# Patient Record
Sex: Female | Born: 1955 | Race: White | Hispanic: No | Marital: Married | Smoking: Never smoker
Health system: Southern US, Community
[De-identification: ages and names within clinical notes are randomized; demographics above are authoritative.]

## PROBLEM LIST (undated history)

## (undated) DIAGNOSIS — I82409 Acute embolism and thrombosis of unspecified deep veins of unspecified lower extremity: Secondary | ICD-10-CM

## (undated) DIAGNOSIS — I10 Essential (primary) hypertension: Secondary | ICD-10-CM

## (undated) HISTORY — DX: Acute embolism and thrombosis of unspecified deep veins of unspecified lower extremity: I82.409

## (undated) HISTORY — PX: COLONOSCOPY WITH PROPOFOL: SHX5780

## (undated) HISTORY — DX: Essential (primary) hypertension: I10

---

## 1985-06-08 DIAGNOSIS — C439 Malignant melanoma of skin, unspecified: Secondary | ICD-10-CM

## 1985-06-08 HISTORY — DX: Malignant melanoma of skin, unspecified: C43.9

## 2001-06-08 HISTORY — PX: BLADDER REMOVAL: SHX567

## 2001-09-29 ENCOUNTER — Other Ambulatory Visit: Admission: RE | Admit: 2001-09-29 | Discharge: 2001-09-29 | Payer: Self-pay | Admitting: Family Medicine

## 2004-03-09 ENCOUNTER — Other Ambulatory Visit: Payer: Self-pay

## 2004-03-09 ENCOUNTER — Emergency Department: Payer: Self-pay | Admitting: Emergency Medicine

## 2005-05-08 ENCOUNTER — Ambulatory Visit: Payer: Self-pay | Admitting: Family Medicine

## 2006-01-06 DIAGNOSIS — I10 Essential (primary) hypertension: Secondary | ICD-10-CM | POA: Insufficient documentation

## 2006-05-10 ENCOUNTER — Ambulatory Visit: Payer: Self-pay | Admitting: Family Medicine

## 2006-06-08 HISTORY — PX: OTHER SURGICAL HISTORY: SHX169

## 2006-10-22 DIAGNOSIS — I82409 Acute embolism and thrombosis of unspecified deep veins of unspecified lower extremity: Secondary | ICD-10-CM

## 2006-10-22 HISTORY — DX: Acute embolism and thrombosis of unspecified deep veins of unspecified lower extremity: I82.409

## 2006-10-25 ENCOUNTER — Ambulatory Visit: Payer: Self-pay | Admitting: Family Medicine

## 2006-10-27 ENCOUNTER — Ambulatory Visit: Payer: Self-pay | Admitting: Family Medicine

## 2007-05-18 ENCOUNTER — Ambulatory Visit: Payer: Self-pay | Admitting: Family Medicine

## 2007-09-26 DIAGNOSIS — G4700 Insomnia, unspecified: Secondary | ICD-10-CM | POA: Insufficient documentation

## 2009-07-29 DIAGNOSIS — E669 Obesity, unspecified: Secondary | ICD-10-CM | POA: Insufficient documentation

## 2009-07-30 ENCOUNTER — Ambulatory Visit: Payer: Self-pay | Admitting: Family Medicine

## 2009-10-22 ENCOUNTER — Ambulatory Visit: Payer: Self-pay | Admitting: Gastroenterology

## 2009-10-22 LAB — HM COLONOSCOPY

## 2010-09-24 ENCOUNTER — Ambulatory Visit: Payer: Self-pay | Admitting: Family Medicine

## 2011-12-08 ENCOUNTER — Ambulatory Visit: Payer: Self-pay | Admitting: Family Medicine

## 2011-12-08 LAB — HM MAMMOGRAPHY: HM Mammogram: NORMAL

## 2013-01-25 LAB — HM PAP SMEAR: HM Pap smear: NEGATIVE

## 2014-03-01 LAB — LIPID PANEL
CHOLESTEROL: 224 mg/dL — AB (ref 0–200)
HDL: 57 mg/dL (ref 35–70)
LDL Cholesterol: 149 mg/dL
TRIGLYCERIDES: 89 mg/dL (ref 40–160)

## 2014-03-01 LAB — BASIC METABOLIC PANEL
BUN: 21 mg/dL (ref 4–21)
Creatinine: 0.9 mg/dL (ref 0.5–1.1)
Glucose: 98 mg/dL
Potassium: 5.4 mmol/L — AB (ref 3.4–5.3)
Sodium: 142 mmol/L (ref 137–147)

## 2014-03-01 LAB — TSH: TSH: 3.58 u[IU]/mL (ref 0.41–5.90)

## 2014-04-30 ENCOUNTER — Ambulatory Visit: Payer: Self-pay | Admitting: Family Medicine

## 2015-02-28 DIAGNOSIS — M79674 Pain in right toe(s): Secondary | ICD-10-CM | POA: Insufficient documentation

## 2015-02-28 DIAGNOSIS — Z8619 Personal history of other infectious and parasitic diseases: Secondary | ICD-10-CM | POA: Insufficient documentation

## 2015-02-28 DIAGNOSIS — N393 Stress incontinence (female) (male): Secondary | ICD-10-CM | POA: Insufficient documentation

## 2015-03-15 ENCOUNTER — Encounter: Payer: Self-pay | Admitting: Family Medicine

## 2015-03-29 ENCOUNTER — Encounter: Payer: Self-pay | Admitting: Family Medicine

## 2015-03-29 ENCOUNTER — Ambulatory Visit (INDEPENDENT_AMBULATORY_CARE_PROVIDER_SITE_OTHER): Payer: Commercial Managed Care - PPO | Admitting: Family Medicine

## 2015-03-29 VITALS — BP 116/82 | HR 80 | Temp 98.8°F | Resp 16 | Ht 61.16 in | Wt 189.0 lb

## 2015-03-29 DIAGNOSIS — I10 Essential (primary) hypertension: Secondary | ICD-10-CM | POA: Diagnosis not present

## 2015-03-29 DIAGNOSIS — Z0000 Encounter for general adult medical examination without abnormal findings: Secondary | ICD-10-CM | POA: Diagnosis not present

## 2015-03-29 NOTE — Progress Notes (Signed)
Patient: Christy Johnson, Female    DOB: Jan 20, 1956, 59 y.o.   MRN: [id number] Visit Date: 03/29/2015  Today's Provider: Lelon Huh, MD   Chief Complaint  Patient presents with  . Annual Exam  . Hypertension  . Hyperlipidemia   Subjective:    Annual physical exam Christy Johnson is a 59 y.o. female who presents today for health maintenance and complete physical. She feels well. She reports exercising yes/walking. She reports she is sleeping poorly most nights.  -----------------------------------------------------------------      Hypertension, follow-up:  BP Readings from Last 3 Encounters:  03/29/15 116/82  03/01/14 120/82    She was last seen for hypertension 13  months ago.  BP at that visit was 120/82. Management since that visit includes none .She reports good compliance with treatment. She is not having side effects. none  She is exercising. She is not adherent to low salt diet.   Outside blood pressures are 116/70. She is experiencing none.  Patient denies none.   Cardiovascular risk factors include none.  Use of agents associated with hypertension: none.   ----------------------------------------------------------------------    Lipid/Cholesterol, Follow-up:   Last seen for this 13  months ago.  Management since that visit includes none.  Last Lipid Panel:    Component Value Date/Time   CHOL 224* 03/01/2014   TRIG 89 03/01/2014   HDL 57 03/01/2014   LDLCALC 149 03/01/2014    She reports good compliance with treatment. She is not having side effects. none  Wt Readings from Last 3 Encounters:  03/29/15 189 lb (85.73 kg)  03/01/14 209 lb (94.802 kg)    ----------------------------------------------------------------------    Review of Systems  Constitutional: Negative.   HENT: Negative.   Eyes: Negative.   Respiratory: Negative.   Cardiovascular: Negative.   Gastrointestinal: Negative.   Endocrine: Negative.     Genitourinary: Negative.   Musculoskeletal: Negative.   Skin: Negative.   Allergic/Immunologic: Negative.   Neurological: Negative.   Hematological: Negative.   Psychiatric/Behavioral: Negative.     Social History She  reports that she has never smoked. She does not have any smokeless tobacco history on file. She reports that she drinks alcohol. She reports that she does not use illicit drugs. Social History   Social History  . Marital Status: Married    Spouse Name: N/A  . Number of Children: N/A  . Years of Education: N/A   Social History Main Topics  . Smoking status: Never Smoker   . Smokeless tobacco: None  . Alcohol Use: Yes     Comment: Ocassionally once a month  . Drug Use: No  . Sexual Activity: Not Asked   Other Topics Concern  . None   Social History Narrative    Patient Active Problem List   Diagnosis Date Noted  . History of chicken pox 02/28/2015  . Stress bladder incontinence, female 02/28/2015  . Obesity (BMI 30.0-34.9) 07/29/2009  . Insomnia 09/26/2007  . 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 Hypertension, essential 01/06/2006    Past Surgical History  Procedure Laterality Date  . Surgery on dvt fron stent put in  2008  . Bladder removal  2003    Removed from Left Ovary    Family History  Family Status  Relation Status Death Age  . Mother Alive   . Father Alive   . Sister Alive     Parathyroid   Her family history includes Cerebrovascular Accident  in her father; Hypertension in her father and mother.    Allergies  Allergen Reactions  . Penicillins Rash    Previous Medications   ASPIRIN 81 MG EC TABLET    Take 1 tablet by mouth daily.   LISINOPRIL (PRINIVIL,ZESTRIL) 10 MG TABLET    Take 10 mg by mouth daily.   MULTIPLE VITAMINS TABLET    Take 1 tablet by mouth daily.   OMEGA-3 FATTY ACIDS (FISH OIL) 1200 MG CAPS    Take 1 capsule by mouth daily.   PROBIOTIC CAPS    Take by mouth.    Patient  Care Team: Birdie Sons, MD as PCP - General (Family Medicine)     Objective:   Vitals: BP 116/82 mmHg  Pulse 80  Temp(Src) 98.8 F (37.1 C) (Oral)  Resp 16  Ht 5' 1.16" (1.553 m)  Wt 189 lb (85.73 kg)  BMI 35.55 kg/m2  SpO2 99%   Physical Exam   General Appearance:    Alert, cooperative, no distress, appears stated age  Head:    Normocephalic, without obvious abnormality, atraumatic  Eyes:    PERRL, conjunctiva/corneas clear, EOM's intact, fundi    benign, both eyes  Ears:    Normal TM's and external ear canals, both ears  Nose:   Nares normal, septum midline, mucosa normal, no drainage    or sinus tenderness  Throat:   Lips, mucosa, and tongue normal; teeth and gums normal  Neck:   Supple, symmetrical, trachea midline, no adenopathy;    thyroid:  no enlargement/tenderness/nodules; no carotid   bruit or JVD  Back:     Symmetric, no curvature, ROM normal, no CVA tenderness  Lungs:     Clear to auscultation bilaterally, respirations unlabored  Chest Wall:    No tenderness or deformity   Heart:    Regular rate and rhythm, S1 and S2 normal, no murmur, rub   or gallop  Breast Exam:    normal appearance, no masses or tenderness  Abdomen:     Soft, non-tender, bowel sounds active all four quadrants,    no masses, no organomegaly  Pelvic:    deferred and /not indicated  Extremities:   Extremities normal, atraumatic, no cyanosis or edema  Pulses:   2+ and symmetric all extremities  Skin:   Skin color, texture, turgor normal, no rashes or lesions  Lymph nodes:   Cervical, supraclavicular, and axillary nodes normal  Neurologic:   CNII-XII intact, normal strength, sensation and reflexes    throughout     Depression Screen PHQ 2/9 Scores 03/29/2015  PHQ - 2 Score 0  PHQ- 9 Score 0      Assessment & Plan:     Routine Health Maintenance and Physical Exam  Exercise Activities and Dietary recommendations Goals    None      Immunization History  Administered  Date(s) Administered  . Tdap 02/11/2006    Health Maintenance  Topic Date Due  . HIV Screening  08/19/1971  . MAMMOGRAM  12/07/2013  . INFLUENZA VACCINE  Getting at work  . PAP SMEAR  01/25/2018  . TETANUS/TDAP  02/12/2016  . COLONOSCOPY  10/23/2019  . Hepatitis C Screening  Completed      Discussed health benefits of physical activity, and encouraged her to engage in regular exercise appropriate for her age and condition.    --------------------------------------------------------------------

## 2015-03-30 LAB — RENAL FUNCTION PANEL
Albumin: 4.4 g/dL (ref 3.5–5.5)
BUN / CREAT RATIO: 24 — AB (ref 9–23)
BUN: 19 mg/dL (ref 6–24)
CO2: 24 mmol/L (ref 18–29)
Calcium: 9.4 mg/dL (ref 8.7–10.2)
Chloride: 100 mmol/L (ref 97–106)
Creatinine, Ser: 0.78 mg/dL (ref 0.57–1.00)
GFR, EST AFRICAN AMERICAN: 97 mL/min/{1.73_m2} (ref >59)
GFR, EST NON AFRICAN AMERICAN: 84 mL/min/{1.73_m2} (ref >59)
GLUCOSE: 102 mg/dL — AB (ref 65–99)
POTASSIUM: 5.2 mmol/L (ref 3.5–5.2)
Phosphorus: 4 mg/dL (ref 2.5–4.5)
SODIUM: 139 mmol/L (ref 136–144)

## 2015-03-30 LAB — LIPID PANEL
CHOL/HDL RATIO: 3.3 ratio (ref 0.0–4.4)
Cholesterol, Total: 197 mg/dL (ref 100–199)
HDL: 60 mg/dL (ref >39)
LDL Calculated: 125 mg/dL — ABNORMAL HIGH (ref 0–99)
Triglycerides: 59 mg/dL (ref 0–149)
VLDL CHOLESTEROL CAL: 12 mg/dL (ref 5–40)

## 2015-04-01 ENCOUNTER — Encounter: Payer: Self-pay | Admitting: Family Medicine

## 2015-04-02 ENCOUNTER — Telehealth: Payer: Self-pay

## 2015-04-02 NOTE — Telephone Encounter (Signed)
Advised pt as directed. Pt verbalized fully understanding.  Thanks,   

## 2015-04-02 NOTE — Telephone Encounter (Signed)
-----   Message from Birdie Sons, MD sent at 03/30/2015  8:11 AM EDT ----- Blood sugar, kidney functions, electrolytes and cholesterol are all normal. Check labs yearly.Continue current medications.

## 2015-04-05 ENCOUNTER — Encounter: Payer: Self-pay | Admitting: Family Medicine

## 2015-04-17 ENCOUNTER — Other Ambulatory Visit: Payer: Self-pay | Admitting: *Deleted

## 2015-04-17 MED ORDER — LISINOPRIL 10 MG PO TABS: 10.0000 mg | ORAL_TABLET | Freq: Every day | ORAL | Status: DC

## 2015-04-17 NOTE — Telephone Encounter (Signed)
Last ov was 03/29/15.

## 2016-06-12 ENCOUNTER — Other Ambulatory Visit: Payer: Self-pay | Admitting: Family Medicine

## 2016-07-08 ENCOUNTER — Encounter: Payer: Commercial Managed Care - PPO | Admitting: Family Medicine

## 2016-08-06 ENCOUNTER — Encounter: Payer: Self-pay | Admitting: Family Medicine

## 2016-08-06 ENCOUNTER — Ambulatory Visit (INDEPENDENT_AMBULATORY_CARE_PROVIDER_SITE_OTHER): Payer: Managed Care, Other (non HMO) | Admitting: Family Medicine

## 2016-08-06 VITALS — BP 118/70 | HR 78 | Temp 98.1°F | Resp 16 | Ht 61.0 in | Wt 195.0 lb

## 2016-08-06 DIAGNOSIS — Z1239 Encounter for other screening for malignant neoplasm of breast: Secondary | ICD-10-CM

## 2016-08-06 DIAGNOSIS — Z23 Encounter for immunization: Secondary | ICD-10-CM

## 2016-08-06 DIAGNOSIS — Z1231 Encounter for screening mammogram for malignant neoplasm of breast: Secondary | ICD-10-CM

## 2016-08-06 DIAGNOSIS — Z0000 Encounter for general adult medical examination without abnormal findings: Secondary | ICD-10-CM

## 2016-08-06 DIAGNOSIS — I10 Essential (primary) hypertension: Secondary | ICD-10-CM

## 2016-08-06 NOTE — Patient Instructions (Signed)
Please call the [facility] [phone]) to schedule a routine screening mammogram.    Preventive Care 40-64 Years, Female Preventive care refers to lifestyle choices and visits with your health care provider that can promote health and wellness. What does preventive care include?  A yearly physical exam. This is also called an annual well check.  Dental exams once or twice a year.  Routine eye exams. Ask your health care provider how often you should have your eyes checked.  Personal lifestyle choices, including:  Daily care of your teeth and gums.  Regular physical activity.  Eating a healthy diet.  Avoiding tobacco and drug use.  Limiting alcohol use.  Practicing safe sex.  Taking low-dose aspirin daily starting at age 41.  Taking vitamin and mineral supplements as recommended by your health care provider. What happens during an annual well check? The services and screenings done by your health care provider during your annual well check will depend on your age, overall health, lifestyle risk factors, and family history of disease. Counseling  Your health care provider may ask you questions about your:  Alcohol use.  Tobacco use.  Drug use.  Emotional well-being.  Home and relationship well-being.  Sexual activity.  Eating habits.  Work and work Statistician.  Method of birth control.  Menstrual cycle.  Pregnancy history. Screening  You may have the following tests or measurements:  Height, weight, and BMI.  Blood pressure.  Lipid and cholesterol levels. These may be checked every 5 years, or more frequently if you are over 74 years old.  Skin check.  Lung cancer screening. You may have this screening every year starting at age 59 if you have a 30-pack-year history of smoking and currently smoke or have quit within the past 15 years.  Fecal occult blood test (FOBT) of the stool. You may have this test every year starting at age  30.  Flexible sigmoidoscopy or colonoscopy. You may have a sigmoidoscopy every 5 years or a colonoscopy every 10 years starting at age 72.  Hepatitis C blood test.  Hepatitis B blood test.  Sexually transmitted disease (STD) testing.  Diabetes screening. This is done by checking your blood sugar (glucose) after you have not eaten for a while (fasting). You may have this done every 1-3 years.  Mammogram. This may be done every 1-2 years. Talk to your health care provider about when you should start having regular mammograms. This may depend on whether you have a family history of breast cancer.  BRCA-related cancer screening. This may be done if you have a family history of breast, ovarian, tubal, or peritoneal cancers.  Pelvic exam and Pap test. This may be done every 3 years starting at age 65. Starting at age 8, this may be done every 5 years if you have a Pap test in combination with an HPV test.  Bone density scan. This is done to screen for osteoporosis. You may have this scan if you are at high risk for osteoporosis. Discuss your test results, treatment options, and if necessary, the need for more tests with your health care provider. Vaccines  Your health care provider may recommend certain vaccines, such as:  Influenza vaccine. This is recommended every year.  Tetanus, diphtheria, and acellular pertussis (Tdap, Td) vaccine. You may need a Td booster every 10 years.  Varicella vaccine. You may need this if you have not been vaccinated.  Zoster vaccine. You may need this after age 85.  Measles, mumps,  and rubella (MMR) vaccine. You may need at least one dose of MMR if you were born in 1957 or later. You may also need a second dose.  Pneumococcal 13-valent conjugate (PCV13) vaccine. You may need this if you have certain conditions and were not previously vaccinated.  Pneumococcal polysaccharide (PPSV23) vaccine. You may need one or two doses if you smoke cigarettes or if you  have certain conditions.  Meningococcal vaccine. You may need this if you have certain conditions.  Hepatitis A vaccine. You may need this if you have certain conditions or if you travel or work in places where you may be exposed to hepatitis A.  Hepatitis B vaccine. You may need this if you have certain conditions or if you travel or work in places where you may be exposed to hepatitis B.  Haemophilus influenzae type b (Hib) vaccine. You may need this if you have certain conditions. Talk to your health care provider about which screenings and vaccines you need and how often you need them. This information is not intended to replace advice given to you by your health care provider. Make sure you discuss any questions you have with your health care provider. Document Released: 06/21/2015 Document Revised: 02/12/2016 Document Reviewed: 03/26/2015 Elsevier Interactive Patient Education  2017 [employer].

## 2016-08-06 NOTE — Progress Notes (Signed)
Patient: Christy Johnson, Female    DOB: January 03, 1956, 61 y.o.   MRN: [id number] Visit Date: 08/06/2016  Today's Provider: Lelon Huh, MD   Chief Complaint  Patient presents with  . Annual Exam  . Hypertension   Subjective:    Annual physical exam Christy Johnson is a 61 y.o. female who presents today for health maintenance and complete physical. She feels well. She reports exercising occasionally . She reports she is sleeping fairly well.   Hypertension, follow-up:  BP Readings from Last 3 Encounters:  08/06/16 118/70  03/29/15 116/82  03/01/14 120/82    She was last seen for hypertension 1 years ago.  BP at that visit was 116/82. Management since that visit includes no changes. She reports good compliance with treatment. She is not having side effects.  She is not exercising. She is adherent to low salt diet.   Outside blood pressures are checked occasionally. Patient denies exertional chest pressure/discomfort, lower extremity edema and palpitations.    Weight trend: stable Wt Readings from Last 3 Encounters:  08/06/16 195 lb (88.5 kg)  03/29/15 189 lb (85.7 kg)  03/01/14 209 lb (94.8 kg)    Current diet: well balanced     Review of Systems  Constitutional: Negative.   HENT: Negative.   Eyes: Negative.   Respiratory: Negative.   Cardiovascular: Negative.   Gastrointestinal: Negative.   Endocrine: Negative.   Genitourinary: Negative.   Musculoskeletal: Negative.   Skin: Negative.   Allergic/Immunologic: Negative.   Neurological: Negative.   Hematological: Negative.   Psychiatric/Behavioral: Negative.     Social History      She  reports that she has never smoked. She has never used smokeless tobacco. She reports that she drinks alcohol. She reports that she does not use drugs.       Social History   Social History  . Marital status: Married    Spouse name: N/A  . Number of children: N/A  . Years of education: N/A   Social History  Main Topics  . Smoking status: Never Smoker  . Smokeless tobacco: Never Used  . Alcohol use Yes     Comment: Ocassionally once a month  . Drug use: No  . Sexual activity: Not Asked   Other Topics Concern  . None   Social History Narrative  . None    Past Medical History:  Diagnosis Date  . 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 History of chicken pox 02/28/2015  . Stress bladder incontinence, female 02/28/2015  . Obesity (BMI 30.0-34.9) 07/29/2009  . Insomnia 09/26/2007  . Hypertension, essential 01/06/2006    Past Surgical History:  Procedure Laterality Date  . BLADDER REMOVAL  2003   Removed from Left Ovary  . Surgery on DVT fron stent put in  2008    Family History        Family Status  Relation Status  . Mother Alive  . Father Alive  . Sister Alive   Parathyroid        Her family history includes Cerebrovascular Accident in her father; Hypertension in her father and mother.     Allergies  Allergen Reactions  . Penicillins Rash     Current Outpatient Prescriptions:  .  aspirin 81 MG EC tablet, Take 1 tablet by mouth daily., Disp: ,  Rfl:  .  lisinopril (PRINIVIL,ZESTRIL) 10 MG tablet, TAKE ONE TABLET BY MOUTH ONCE DAILY, Disp: 90 tablet, Rfl: 1 .  Multiple Vitamins tablet, Take 1 tablet by mouth daily., Disp: , Rfl:  .  Omega-3 Fatty Acids (FISH OIL) 1200 MG CAPS, Take 1 capsule by mouth daily., Disp: , Rfl:  .  Probiotic CAPS, Take by mouth., Disp: , Rfl:    Patient Care Team: Birdie Sons, MD as PCP - General (Family Medicine)      Objective:   Vitals: BP 118/70 (BP Location: Right Arm, Patient Position: Sitting, Cuff Size: Normal)   Pulse 78   Temp 98.1 F (36.7 C)   Resp 16   Ht 5\' 1"  (1.549 m)   Wt 195 lb (88.5 kg)   SpO2 98%   BMI 36.84 kg/m    Vitals:   08/06/16 0904  BP: 118/70  Pulse: 78  Resp: 16    Temp: 98.1 F (36.7 C)  SpO2: 98%  Weight: 195 lb (88.5 kg)  Height: 5\' 1"  (1.549 m)     Physical Exam  Constitutional: She is oriented to person, place, and time. She appears well-developed and well-nourished.  HENT:  Head: Normocephalic and atraumatic.  Right Ear: External ear normal.  Left Ear: External ear normal.  Nose: Nose normal.  Mouth/Throat: Oropharynx is clear and moist.  Eyes: Conjunctivae and EOM are normal. Pupils are equal, round, and reactive to light.  Neck: Normal range of motion.  Cardiovascular: Normal rate, regular rhythm and normal heart sounds.   Pulmonary/Chest: Effort normal and breath sounds normal.  Abdominal: Soft. Bowel sounds are normal.  Musculoskeletal: Normal range of motion.  Neurological: She is alert and oriented to person, place, and time.  Skin: Skin is warm and dry.  Psychiatric: She has a normal mood and affect. Her behavior is normal. Judgment and thought content normal.     Depression Screen PHQ 2/9 Scores 08/06/2016 03/29/2015  PHQ - 2 Score 0 0  PHQ- 9 Score 2 0      Assessment & Plan:     Routine Health Maintenance and Physical Exam  Exercise Activities and Dietary recommendations Goals    None      Immunization History  Administered Date(s) Administered  . Tdap 02/11/2006    Health Maintenance  Topic Date Due  . HIV Screening  08/19/1971  . MAMMOGRAM  12/07/2013  . INFLUENZA VACCINE  01/07/2016  . PAP SMEAR  01/26/2016  . TETANUS/TDAP  02/12/2016  . COLONOSCOPY  10/23/2019  . Hepatitis C Screening  Completed     Discussed health benefits of physical activity, and encouraged her to engage in regular exercise appropriate for her age and condition.     1. Annual physical exam  - Pap IG (Image Guided)  2. Hypertension, essential Well controlled.  Continue current medications.   - Lipid panel - Renal function panel - TSH  3. Breast cancer screening  - MM Digital Screening; Future  4. Need for  vaccine for TD (tetanus-diphtheria)  - Td : Tetanus/diphtheria >60yo Preservative  free  The entirety of the information documented in the History of Present Illness, Review of Systems and Physical Exam were personally obtained by me. Portions of this information were initially documented by Wilburt Finlay, CMA and reviewed by me for thoroughness and accuracy.    Lelon Huh, MD  [facility]

## 2016-08-07 LAB — RENAL FUNCTION PANEL
Albumin: 4.6 g/dL (ref 3.5–5.5)
BUN / CREAT RATIO: 21 (ref 9–23)
BUN: 17 mg/dL (ref 6–24)
CALCIUM: 9.2 mg/dL (ref 8.7–10.2)
CHLORIDE: 101 mmol/L (ref 96–106)
CO2: 21 mmol/L (ref 18–29)
Creatinine, Ser: 0.8 mg/dL (ref 0.57–1.00)
GFR calc Af Amer: 93 mL/min/{1.73_m2} (ref >59)
GFR calc non Af Amer: 81 mL/min/{1.73_m2} (ref >59)
Glucose: 99 mg/dL (ref 65–99)
PHOSPHORUS: 3.2 mg/dL (ref 2.5–4.5)
POTASSIUM: 5 mmol/L (ref 3.5–5.2)
SODIUM: 140 mmol/L (ref 134–144)

## 2016-08-07 LAB — LIPID PANEL
CHOL/HDL RATIO: 3 ratio (ref 0.0–4.4)
Cholesterol, Total: 206 mg/dL — ABNORMAL HIGH (ref 100–199)
HDL: 68 mg/dL (ref >39)
LDL Calculated: 125 mg/dL — ABNORMAL HIGH (ref 0–99)
TRIGLYCERIDES: 67 mg/dL (ref 0–149)
VLDL Cholesterol Cal: 13 mg/dL (ref 5–40)

## 2016-08-07 LAB — TSH: TSH: 3.98 u[IU]/mL (ref 0.450–4.500)

## 2016-08-09 LAB — PAP IG (IMAGE GUIDED): PAP SMEAR COMMENT: 0

## 2016-08-10 ENCOUNTER — Telehealth: Payer: Self-pay | Admitting: Emergency Medicine

## 2016-08-10 NOTE — Telephone Encounter (Signed)
Pt returning call , please call back  °

## 2016-08-10 NOTE — Telephone Encounter (Signed)
-----   Message from Birdie Sons, MD sent at 08/10/2016  8:00 AM EST ----- Mildly elevated cholesterol. Rest of labs are normal. Pap is normal. Continue current medications.  Try to cut back on saturated fats. Check labs yearly. Repeat pap in 3-4 years.

## 2016-08-10 NOTE — Telephone Encounter (Signed)
Advised patient of results.  

## 2016-08-12 ENCOUNTER — Encounter: Payer: Self-pay | Admitting: Family Medicine

## 2016-08-27 ENCOUNTER — Ambulatory Visit
Admission: RE | Admit: 2016-08-27 | Discharge: 2016-08-27 | Disposition: A | Discharge status: Home / Self Care | Payer: Managed Care, Other (non HMO) | Source: Ambulatory Visit | Attending: Family Medicine | Admitting: Family Medicine

## 2016-08-27 DIAGNOSIS — Z1239 Encounter for other screening for malignant neoplasm of breast: Secondary | ICD-10-CM

## 2016-08-27 DIAGNOSIS — Z1231 Encounter for screening mammogram for malignant neoplasm of breast: Secondary | ICD-10-CM | POA: Insufficient documentation

## 2017-01-06 ENCOUNTER — Other Ambulatory Visit: Payer: Self-pay | Admitting: Family Medicine

## 2018-01-13 NOTE — Progress Notes (Signed)
Patient: Christy Johnson, Female    DOB: 07/26/1955, 62 y.o.   MRN: [id number] Visit Date: 01/14/2018  Today's Provider: Lelon Huh, MD   Chief Complaint  Patient presents with  . Annual Exam  . Hyperlipidemia  . Hypertension   Subjective:    Annual physical exam Christy Johnson is a 62 y.o. female who presents today for health maintenance and complete physical. She feels well. She reports no regular exercising. She reports she is sleeping poorly (sometimes has trouble staying asleep).  -----------------------------------------------------------------   Hypertension, follow-up:  BP Readings from Last 3 Encounters:  01/14/18 128/84  08/06/16 118/70  03/29/15 116/82    She was last seen for hypertension 08/06/2016.  BP at that visit was 118/70. Management since that visit includes; no changes.She reports good compliance with treatment. She is not having side effects.  She is not exercising. She is adherent to low salt diet.   Outside blood pressures are checked occasionally and average 128/75. She is experiencing none.  Patient denies chest pain, chest pressure/discomfort, claudication, dyspnea, exertional chest pressure/discomfort, fatigue, irregular heart beat, lower extremity edema, near-syncope, orthopnea, palpitations, paroxysmal nocturnal dyspnea, syncope and tachypnea.   Cardiovascular risk factors include dyslipidemia and hypertension.  Use of agents associated with hypertension: NSAIDS.   ------------------------------------------------------------------------    Lipid/Cholesterol, Follow-up:   Last seen for this 08/06/2016.  Management since that visit includes; labs checked, no changes.  Last Lipid Panel:    Component Value Date/Time   CHOL 206 (H) 08/06/2016 0956   TRIG 67 08/06/2016 0956   HDL 68 08/06/2016 0956   CHOLHDL 3.0 08/06/2016 0956   LDLCALC 125 (H) 08/06/2016 0956    She reports good compliance with treatment. She is not having  side effects.   Wt Readings from Last 3 Encounters:  01/14/18 209 lb (94.8 kg)  08/06/16 195 lb (88.5 kg)  03/29/15 189 lb (85.7 kg)    ------------------------------------------------------------------------    Review of Systems  Constitutional: Negative for chills, fatigue and fever.  HENT: Positive for postnasal drip. Negative for congestion, ear pain, rhinorrhea, sneezing and sore throat.   Eyes: Negative.  Negative for pain and redness.  Respiratory: Negative for cough, shortness of breath and wheezing.   Cardiovascular: Negative for chest pain and leg swelling.  Gastrointestinal: Negative for abdominal pain, blood in stool, constipation, diarrhea and nausea.  Endocrine: Negative for polydipsia and polyphagia.  Genitourinary: Negative.  Negative for dysuria, flank pain, hematuria, pelvic pain, vaginal bleeding and vaginal discharge.  Musculoskeletal: Negative for arthralgias, back pain, gait problem and joint swelling.  Skin: Negative for rash.  Neurological: Negative.  Negative for dizziness, tremors, seizures, weakness, light-headedness, numbness and headaches.  Hematological: Negative for adenopathy.  Psychiatric/Behavioral: Negative.  Negative for behavioral problems, confusion and dysphoric mood. The patient is not nervous/anxious and is not hyperactive.     Social History      She  reports that she has never smoked. She has never used smokeless tobacco. She reports that she drank alcohol. She reports that she does not use drugs.       Social History   Socioeconomic History  . Marital status: Married    Spouse name: Not on file  . Number of children: 3  . Years of education: Not on file  . Highest education level: Not on file  Occupational History  . Occupation: clinical team coordinator    Employer: [employer]  Social Needs  . Financial resource strain: Not on  file  . Food insecurity:    Worry: Not on file    Inability: Not on file  . Transportation needs:     Medical: Not on file    Non-medical: Not on file  Tobacco Use  . Smoking status: Never Smoker  . Smokeless tobacco: Never Used  Substance and Sexual Activity  . Alcohol use: Not Currently    Comment: Ocassionally once a month  . Drug use: No  . Sexual activity: Not on file  Lifestyle  . Physical activity:    Days per week: Not on file    Minutes per session: Not on file  . Stress: Not on file  Relationships  . Social connections:    Talks on phone: Not on file    Gets together: Not on file    Attends religious service: Not on file    Active member of club or organization: Not on file    Attends meetings of clubs or organizations: Not on file    Relationship status: Not on file  Other Topics Concern  . Not on file  Social History Narrative  . Not on file    Past Medical History:  Diagnosis Date  . 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 History of chicken pox 02/28/2015  . Stress bladder incontinence, female 02/28/2015  . Obesity (BMI 30.0-34.9) 07/29/2009  . Insomnia 09/26/2007  . Hypertension, essential 01/06/2006    Past Surgical History:  Procedure Laterality Date  . BLADDER REMOVAL  2003   Removed from Left Ovary  . Surgery on DVT fron stent put in  2008    Family History        Family Status  Relation Name Status  . Mother  Alive  . Father  Alive  . Sister  Alive       Parathyroid  . Cousin  (Not Specified)  . Son  (Not Specified)        Her family history includes Cerebrovascular Accident in her father; Hypertension in her father and mother; Melanoma in her son; Thyroid cancer in her cousin.      Allergies  Allergen Reactions  . Penicillins Rash     Current Outpatient Medications:  .  aspirin 81 MG EC tablet, Take 1 tablet by mouth daily., Disp: , Rfl:  .  lisinopril (PRINIVIL,ZESTRIL) 10 MG tablet, TAKE ONE TABLET  BY MOUTH ONCE DAILY, Disp: 90 tablet, Rfl: 4 .  Multiple Vitamins tablet, Take 1 tablet by mouth daily., Disp: , Rfl:  .  Omega-3 Fatty Acids (FISH OIL) 1200 MG CAPS, Take 1 capsule by mouth daily., Disp: , Rfl:  .  Probiotic CAPS, Take by mouth., Disp: , Rfl:    Patient Care Team: Birdie Sons, MD as PCP - General (Family Medicine)      Objective:   Vitals: BP 128/84 (BP Location: Left Arm, Patient Position: Sitting, Cuff Size: Large)   Pulse 82   Temp 97.7 F (36.5 C) (Oral)   Resp 16   Ht 5\' 1"  (1.549 m)   Wt 209 lb (94.8 kg)   LMP  (LMP Unknown)   SpO2 99% Comment: room air  BMI 39.49 kg/m    Vitals:   01/14/18 0902  BP: 128/84  Pulse: 82  Resp: 16  Temp: 97.7 F (36.5 C)  TempSrc: Oral  SpO2: 99%  Weight: 209 lb (94.8 kg)  Height: 5\' 1"  (1.549 m)     Physical Exam   General Appearance:    Alert, cooperative, no distress, appears stated age, obese  Head:    Normocephalic, without obvious abnormality, atraumatic  Eyes:    PERRL, conjunctiva/corneas clear, EOM's intact, fundi    benign, both eyes  Ears:    Normal TM's and external ear canals, both ears  Nose:   Nares normal, septum midline, mucosa normal, no drainage    or sinus tenderness  Throat:   Lips, mucosa, and tongue normal; teeth and gums normal  Neck:   Supple, symmetrical, trachea midline, no adenopathy;    thyroid:  no enlargement/tenderness/nodules; no carotid   bruit or JVD  Back:     Symmetric, no curvature, ROM normal, no CVA tenderness  Lungs:     Clear to auscultation bilaterally, respirations unlabored  Chest Wall:    No tenderness or deformity   Heart:    Regular rate and rhythm, S1 and S2 normal, no murmur, rub   or gallop  Breast Exam:    normal appearance, no masses or tenderness, deferred  Abdomen:     Soft, non-tender, bowel sounds active all four quadrants,    no masses, no organomegaly  Pelvic:    deferred  Extremities:   Extremities normal, atraumatic, no cyanosis or edema    Pulses:   2+ and symmetric all extremities  Skin:   Skin color, texture, turgor normal, no rashes or lesions  Lymph nodes:   Cervical, supraclavicular, and axillary nodes normal  Neurologic:   CNII-XII intact, normal strength, sensation and reflexes    throughout    Depression Screen PHQ 2/9 Scores 01/14/2018 08/06/2016 03/29/2015  PHQ - 2 Score 0 0 0  PHQ- 9 Score 6 2 0      Assessment & Plan:     Routine Health Maintenance and Physical Exam  Exercise Activities and Dietary recommendations Goals   None     Immunization History  Administered Date(s) Administered  . Td 08/06/2016  . Tdap 02/11/2006    Health Maintenance  Topic Date Due  . HIV Screening  08/19/1971  . INFLUENZA VACCINE  01/06/2018  . MAMMOGRAM  08/28/2018  . PAP SMEAR  08/07/2019  . COLONOSCOPY  10/23/2019  . TETANUS/TDAP  08/07/2026  . Hepatitis C Screening  Completed     Discussed health benefits of physical activity, and encouraged her to engage in regular exercise appropriate for her age and condition.    --------------------------------------------------------------------  1. Annual physical exam She decline shingrix. Pap due after 07/2019.   2. Hypertension, essential Fairly well controlled. Continue current medications.   - EKG 12-Lead - Lipid panel - Comprehensive metabolic panel - TSH  3. Obesity (BMI 30.0-34.9) Counseled regarding prudent diet and regular exercise.   4. Allergic rhinitis due to pollen, unspecified seasonality Recommend she try OTC fluticasone.    Lelon Huh, MD  [facility]

## 2018-01-14 ENCOUNTER — Other Ambulatory Visit: Payer: Self-pay | Admitting: Family Medicine

## 2018-01-14 ENCOUNTER — Encounter: Payer: Self-pay | Admitting: Family Medicine

## 2018-01-14 ENCOUNTER — Ambulatory Visit (INDEPENDENT_AMBULATORY_CARE_PROVIDER_SITE_OTHER): Payer: Managed Care, Other (non HMO) | Admitting: Family Medicine

## 2018-01-14 VITALS — BP 128/84 | HR 82 | Temp 97.7°F | Resp 16 | Ht 61.0 in | Wt 209.0 lb

## 2018-01-14 DIAGNOSIS — E669 Obesity, unspecified: Secondary | ICD-10-CM | POA: Diagnosis not present

## 2018-01-14 DIAGNOSIS — Z0000 Encounter for general adult medical examination without abnormal findings: Secondary | ICD-10-CM

## 2018-01-14 DIAGNOSIS — I10 Essential (primary) hypertension: Secondary | ICD-10-CM

## 2018-01-14 DIAGNOSIS — J309 Allergic rhinitis, unspecified: Secondary | ICD-10-CM | POA: Insufficient documentation

## 2018-01-14 DIAGNOSIS — Z1231 Encounter for screening mammogram for malignant neoplasm of breast: Secondary | ICD-10-CM

## 2018-01-14 DIAGNOSIS — J301 Allergic rhinitis due to pollen: Secondary | ICD-10-CM | POA: Diagnosis not present

## 2018-01-14 NOTE — Patient Instructions (Signed)
   The CDC recommends two doses of Shingrix (the shingles vaccine) separated by 2 to 6 months for adults age 61 years and older. I recommend checking with your insurance plan regarding coverage for this vaccine.   

## 2018-01-15 LAB — COMPREHENSIVE METABOLIC PANEL
A/G RATIO: 1.8 (ref 1.2–2.2)
ALK PHOS: 87 IU/L (ref 39–117)
ALT: 26 IU/L (ref 0–32)
AST: 23 IU/L (ref 0–40)
Albumin: 4.6 g/dL (ref 3.6–4.8)
BILIRUBIN TOTAL: 0.4 mg/dL (ref 0.0–1.2)
BUN/Creatinine Ratio: 18 (ref 12–28)
BUN: 14 mg/dL (ref 8–27)
CHLORIDE: 102 mmol/L (ref 96–106)
CO2: 22 mmol/L (ref 20–29)
Calcium: 9.3 mg/dL (ref 8.7–10.3)
Creatinine, Ser: 0.79 mg/dL (ref 0.57–1.00)
GFR calc Af Amer: 93 mL/min/{1.73_m2} (ref >59)
GFR calc non Af Amer: 81 mL/min/{1.73_m2} (ref >59)
Globulin, Total: 2.6 g/dL (ref 1.5–4.5)
Glucose: 95 mg/dL (ref 65–99)
POTASSIUM: 4.8 mmol/L (ref 3.5–5.2)
SODIUM: 138 mmol/L (ref 134–144)
Total Protein: 7.2 g/dL (ref 6.0–8.5)

## 2018-01-15 LAB — LIPID PANEL
CHOLESTEROL TOTAL: 198 mg/dL (ref 100–199)
Chol/HDL Ratio: 4 ratio (ref 0.0–4.4)
HDL: 50 mg/dL (ref >39)
LDL CALC: 129 mg/dL — AB (ref 0–99)
Triglycerides: 96 mg/dL (ref 0–149)
VLDL CHOLESTEROL CAL: 19 mg/dL (ref 5–40)

## 2018-01-15 LAB — TSH: TSH: 3.57 u[IU]/mL (ref 0.450–4.500)

## 2018-01-20 ENCOUNTER — Encounter: Payer: Self-pay | Admitting: Family Medicine

## 2018-01-26 ENCOUNTER — Ambulatory Visit
Admission: RE | Admit: 2018-01-26 | Discharge: 2018-01-26 | Disposition: A | Discharge status: Home / Self Care | Payer: Managed Care, Other (non HMO) | Source: Ambulatory Visit | Attending: Family Medicine | Admitting: Family Medicine

## 2018-01-26 DIAGNOSIS — Z1231 Encounter for screening mammogram for malignant neoplasm of breast: Secondary | ICD-10-CM | POA: Diagnosis not present

## 2018-02-02 ENCOUNTER — Other Ambulatory Visit: Payer: Self-pay | Admitting: Family Medicine

## 2018-06-06 ENCOUNTER — Telehealth: Payer: Managed Care, Other (non HMO) | Admitting: Family

## 2018-06-06 DIAGNOSIS — J069 Acute upper respiratory infection, unspecified: Secondary | ICD-10-CM

## 2018-06-06 DIAGNOSIS — J00 Acute nasopharyngitis [common cold]: Secondary | ICD-10-CM

## 2018-06-06 MED ORDER — FLUTICASONE PROPIONATE 50 MCG/ACT NA SUSP: 1.0000 | Freq: Two times a day (BID) | NASAL | 6 refills | Status: DC

## 2018-06-06 MED ORDER — BENZONATATE 100 MG PO CAPS: 200.0000 mg | ORAL_CAPSULE | Freq: Three times a day (TID) | ORAL | 0 refills | Status: DC | PRN

## 2018-06-06 MED ORDER — CETIRIZINE HCL 10 MG PO TABS: 10.0000 mg | ORAL_TABLET | Freq: Every day | ORAL | 11 refills | Status: DC

## 2018-06-06 MED ORDER — BENZONATATE 100 MG PO CAPS: 100.0000 mg | ORAL_CAPSULE | Freq: Three times a day (TID) | ORAL | 0 refills | Status: DC | PRN

## 2018-06-06 MED ORDER — FLUTICASONE PROPIONATE 50 MCG/ACT NA SUSP: 2.0000 | Freq: Every day | NASAL | 0 refills | Status: DC

## 2018-06-06 NOTE — Progress Notes (Signed)
We are sorry that you are not feeling well.  Here is how we plan to help!  Based on your presentation I believe you most likely have A cough due to allergies.  I recommend that you start the an over-the counter-allergy medication such as Claritin 10 mg or Zyrtec 10 mg daily.     In addition you may use A prescription cough medication called Tessalon Perles 100mg . You may take 1-2 capsules every 8 hours as needed for your cough.  I have prescribed Flonase, a steroid base nasal spray that would help with underlying allergy symptoms and congestion.  Wheezing is not expected with such a short illness duration of 3 days, with no past medical illness of lung disease such as Asthma. If you are wheezing, or continue to wheeze, please go to an urgent care for evaluation.      From your responses in the eVisit questionnaire you describe inflammation in the upper respiratory tract which is causing a significant cough.  This is commonly called Bronchitis and has four common causes:    Allergies  Viral Infections  Acid Reflux  Bacterial Infection Allergies, viruses and acid reflux are treated by controlling symptoms or eliminating the cause. An example might be a cough caused by taking certain blood pressure medications. You stop the cough by changing the medication. Another example might be a cough caused by acid reflux. Controlling the reflux helps control the cough.  USE OF BRONCHODILATOR ("RESCUE") INHALERS: There is a risk from using your bronchodilator too frequently.  The risk is that over-reliance on a medication which only relaxes the muscles surrounding the breathing tubes can reduce the effectiveness of medications prescribed to reduce swelling and congestion of the tubes themselves.  Although you feel brief relief from the bronchodilator inhaler, your asthma may actually be worsening with the tubes becoming more swollen and filled with mucus.  This can delay other crucial treatments, such as  oral steroid medications. If you need to use a bronchodilator inhaler daily, several times per day, you should discuss this with your provider.  There are probably better treatments that could be used to keep your asthma under control.     HOME CARE . Only take medications as instructed by your medical team. . Complete the entire course of an antibiotic. . Drink plenty of fluids and get plenty of rest. . Avoid close contacts especially the very young and the elderly . Cover your mouth if you cough or cough into your sleeve. . Always remember to wash your hands . A steam or ultrasonic humidifier can help congestion.   GET HELP RIGHT AWAY IF: . You develop worsening fever. . You become short of breath . You cough up blood. . Your symptoms persist after you have completed your treatment plan MAKE SURE YOU   Understand these instructions.  Will watch your condition.  Will get help right away if you are not doing well or get worse.  Your e-visit answers were reviewed by a board certified advanced clinical practitioner to complete your personal care plan.  Depending on the condition, your plan could have included both over the counter or prescription medications. If there is a problem please reply  once you have received a response from your provider. Your safety is important to [place].  If you have drug allergies check your prescription carefully.    You can use MyChart to ask questions about today's visit, request a non-urgent call back, or ask for a work or school  excuse for 24 hours related to this e-Visit. If it has been greater than 24 hours you will need to follow up with your provider, or enter a new e-Visit to address those concerns. You will get an e-mail in the next two days asking about your experience.  I hope that your e-visit has been valuable and will speed your recovery. Thank you for using e-visits.

## 2018-06-06 NOTE — Progress Notes (Signed)
Thank you for the details you included in the comment boxes. Those details are very helpful in determining the best course of treatment for you and help [place] to provide the best care.  We are sorry you are not feeling well.  Here is how we plan to help!  Based on what you have shared with me, it looks like you may have a viral upper respiratory infection or a "common cold".  Colds are caused by a large number of viruses; however, rhinovirus is the most common cause.   Symptoms of the common cold vary from person to person, with common symptoms including sore throat, cough, and malaise.  A low-grade fever of 100.4 may present, but is often uncommon.  Symptoms vary however, and are closely related to a person's age or underlying illnesses.  The most common symptoms associated with the common cold are nasal discharge or congestion, cough, sneezing, headache and pressure in the ears and face.  Cold symptoms usually persist for about 3 to 10 days, but can last up to 2 weeks.  It is important to know that colds do not cause serious illness or complications in most cases.    The common cold is transmitted from person to person, with the most common method of transmission being a person's hands.  The virus is able to live on the skin and can infect other persons for up to 2 hours after direct contact.  Also, colds are transmitted when someone coughs or sneezes; thus, it is important to cover the mouth to reduce this risk.  To keep the spread of the common cold at [place], good hand hygiene is very important.  This is an infection that is most likely caused by a virus. There are no specific treatments for the common cold other than to help you with the symptoms until the infection runs its course.    For nasal congestion, you may use an oral decongestants such as Mucinex D or if you have glaucoma or high blood pressure use plain Mucinex.  Saline nasal spray or nasal drops can help and can safely be used as often as  needed for congestion.  For your congestion, I have prescribed Fluticasone nasal spray one spray in each nostril twice a day  If you do not have a history of heart disease, hypertension, diabetes or thyroid disease, prostate/bladder issues or glaucoma, you may also use Sudafed to treat nasal congestion.  It is highly recommended that you consult with a pharmacist or your primary care physician to ensure this medication is safe for you to take.     If you have a cough, you may use cough suppressants such as Delsym and Robitussin.  If you have glaucoma or high blood pressure, you can also use Coricidin HBP.   For cough I have prescribed for you A prescription cough medication called Tessalon Perles 100 mg. You may take 1-2 capsules every 8 hours as needed for cough  If you have a sore or scratchy throat, use a saltwater gargle-  to  teaspoon of salt dissolved in a 4-ounce to 8-ounce glass of warm water.  Gargle the solution for approximately 15-30 seconds and then spit.  It is important not to swallow the solution.  You can also use throat lozenges/cough drops and Chloraseptic spray to help with throat pain or discomfort.  Warm or cold liquids can also be helpful in relieving throat pain.  For headache, pain or general discomfort, you can use Ibuprofen or Tylenol  as directed.   Some authorities believe that zinc sprays or the use of Echinacea may shorten the course of your symptoms.   HOME CARE . Only take medications as instructed by your medical team. . Be sure to drink plenty of fluids. Water is fine as well as fruit juices, sodas and electrolyte beverages. You may want to stay away from caffeine or alcohol. If you are nauseated, try taking small sips of liquids. How do you know if you are getting enough fluid? Your urine should be a pale yellow or almost colorless. . Get rest. . Taking a steamy shower or using a humidifier may help nasal congestion and ease sore throat pain. You can place a  towel over your head and breathe in the steam from hot water coming from a faucet. . Using a saline nasal spray works much the same way. . Cough drops, hard candies and sore throat lozenges may ease your cough. . Avoid close contacts especially the very young and the elderly . Cover your mouth if you cough or sneeze . Always remember to wash your hands.   GET HELP RIGHT AWAY IF: . You develop worsening fever. . If your symptoms do not improve within 10 days . You develop yellow or green discharge from your nose over 3 days. . You have coughing fits . You develop a severe head ache or visual changes. . You develop shortness of breath, difficulty breathing or start having chest pain . Your symptoms persist after you have completed your treatment plan  MAKE SURE YOU   Understand these instructions.  Will watch your condition.  Will get help right away if you are not doing well or get worse.  Your e-visit answers were reviewed by a board certified advanced clinical practitioner to complete your personal care plan. Depending upon the condition, your plan could have included both over the counter or prescription medications. Please review your pharmacy choice. If there is a problem, you may call our nursing hot line at and have the prescription routed to another pharmacy. Your safety is important to [place]. If you have drug allergies check your prescription carefully.   You can use MyChart to ask questions about today's visit, request a non-urgent call back, or ask for a work or school excuse for 24 hours related to this e-Visit. If it has been greater than 24 hours you will need to follow up with your provider, or enter a new e-Visit to address those concerns. You will get an e-mail in the next two days asking about your experience.  I hope that your e-visit has been valuable and will speed your recovery. Thank you for using e-visits.

## 2018-08-08 ENCOUNTER — Telehealth: Payer: Managed Care, Other (non HMO) | Admitting: Family

## 2018-08-08 DIAGNOSIS — H1010 Acute atopic conjunctivitis, unspecified eye: Secondary | ICD-10-CM

## 2018-08-08 DIAGNOSIS — J309 Allergic rhinitis, unspecified: Secondary | ICD-10-CM

## 2018-08-08 NOTE — Progress Notes (Signed)
We are sorry that you are not feeling well.  Here is how we plan to help!  Based on what you have shared with me it looks like you have conjunctivitis.  Conjunctivitis is a common inflammatory or infectious condition of the eye that is often referred to as "pink eye".  In most cases it is contagious (viral or bacterial). However, not all conjunctivitis requires antibiotics (ex. Allergic).  We have made appropriate suggestions for you based upon your presentation.  I recommend that you use OpconA, 1-2 drops every 4-6 hours (an over the counter allergy drop available at your local pharmacy).  Your pharmacist may have an alternative suggestion.  Pink eye can be highly contagious.  It is typically spread through direct contact with secretions, or contaminated objects or surfaces that one may have touched.  Strict handwashing is suggested with soap and water is urged.  If not available, use alcohol based had sanitizer.  Avoid unnecessary touching of the eye.  If you wear contact lenses, you will need to refrain from wearing them until you see no white discharge from the eye for at least 24 hours after being on medication.  You should see symptom improvement in 1-2 days after starting the medication regimen.  Call us if symptoms are not improved in 1-2 days.  Home Care:  Wash your hands often!  Do not wear your contacts until you complete your treatment plan.  Avoid sharing towels, bed linen, personal items with a person who has pink eye.  See attention for anyone in your home with similar symptoms.  Get Help Right Away If:  Your symptoms do not improve.  You develop blurred or loss of vision.  Your symptoms worsen (increased discharge, pain or redness)  Your e-visit answers were reviewed by a board certified advanced clinical practitioner to complete your personal care plan.  Depending on the condition, your plan could have included both over the counter or prescription medications.  If there  is a problem please reply  once you have received a response from your provider.  Your safety is important to us.  If you have drug allergies check your prescription carefully.    You can use MyChart to ask questions about today's visit, request a non-urgent call back, or ask for a work or school excuse for 24 hours related to this e-Visit. If it has been greater than 24 hours you will need to follow up with your provider, or enter a new e-Visit to address those concerns.   You will get an e-mail in the next two days asking about your experience.  I hope that your e-visit has been valuable and will speed your recovery. Thank you for using e-visits.     

## 2018-10-15 NOTE — Progress Notes (Signed)
Greater than 5 minutes, yet less than 10 minutes of time have been spent researching, coordinating, and implementing care for this patient today.  Thank you for the details you included in the comment boxes. Those details are very helpful in determining the best course of treatment for you and help us to provide the best care.  

## 2018-11-21 ENCOUNTER — Encounter (INDEPENDENT_AMBULATORY_CARE_PROVIDER_SITE_OTHER): Payer: Self-pay

## 2018-11-21 ENCOUNTER — Encounter: Payer: Self-pay | Admitting: Physician Assistant

## 2018-11-21 ENCOUNTER — Ambulatory Visit (INDEPENDENT_AMBULATORY_CARE_PROVIDER_SITE_OTHER): Payer: Managed Care, Other (non HMO) | Admitting: Physician Assistant

## 2018-11-21 VITALS — BP 144/76 | HR 94 | Temp 100.4°F

## 2018-11-21 DIAGNOSIS — R6889 Other general symptoms and signs: Secondary | ICD-10-CM

## 2018-11-21 DIAGNOSIS — Z20822 Contact with and (suspected) exposure to covid-19: Secondary | ICD-10-CM

## 2018-11-21 DIAGNOSIS — R509 Fever, unspecified: Secondary | ICD-10-CM

## 2018-11-21 NOTE — Patient Instructions (Signed)
Person Under Monitoring Name: Christy Johnson  Location: [place] [address]   Infection Prevention Recommendations for Individuals Confirmed to have, or Being Evaluated for, 2019 Novel Coronavirus (COVID-19) Infection Who Receive Care at Home  Individuals who are confirmed to have, or are being evaluated for, COVID-19 should follow the prevention steps below until a healthcare provider or local or state health department says they can return to normal activities.  Stay home except to get medical care You should restrict activities outside your home, except for getting medical care. Do not go to work, school, or public areas, and do not use public transportation or taxis.  Call ahead before visiting your doctor Before your medical appointment, call the healthcare provider and tell them that you have, or are being evaluated for, COVID-19 infection. This will help the healthcare provider's office take steps to keep other people from getting infected. Ask your healthcare provider to call the local or state health department.  Monitor your symptoms Seek prompt medical attention if your illness is worsening (e.g., difficulty breathing). Before going to your medical appointment, call the healthcare provider and tell them that you have, or are being evaluated for, COVID-19 infection. Ask your healthcare provider to call the local or state health department.  Wear a facemask You should wear a facemask that covers your nose and mouth when you are in the same room with other people and when you visit a healthcare provider. People who live with or visit you should also wear a facemask while they are in the same room with you.  Separate yourself from other people in your home As much as possible, you should stay in a different room from other people in your home. Also, you should use a separate bathroom, if available.  Avoid sharing household items You should not  share dishes, drinking glasses, cups, eating utensils, towels, bedding, or other items with other people in your home. After using these items, you should wash them thoroughly with soap and water.  Cover your coughs and sneezes Cover your mouth and nose with a tissue when you cough or sneeze, or you can cough or sneeze into your sleeve. Throw used tissues in a lined trash can, and immediately wash your hands with soap and water for at least 20 seconds or use an alcohol-based hand rub.  Wash your [facility] your hands often and thoroughly with soap and water for at least 20 seconds. You can use an alcohol-based hand sanitizer if soap and water are not available and if your hands are not visibly dirty. Avoid touching your eyes, nose, and mouth with unwashed hands.   Prevention Steps for Caregivers and Household Members of Individuals Confirmed to have, or Being Evaluated for, COVID-19 Infection Being Cared for in the Home  If you live with, or provide care at home for, a person confirmed to have, or being evaluated for, COVID-19 infection please follow these guidelines to prevent infection:  Follow healthcare provider's instructions Make sure that you understand and can help the patient follow any healthcare provider instructions for all care.  Provide for the patient's basic needs You should help the patient with basic needs in the home and provide support for getting groceries, prescriptions, and other personal needs.  Monitor the patient's symptoms If they are getting sicker, call his or her medical provider and tell them that the patient has, or is being evaluated for, COVID-19 infection. This will help the healthcare provider's office  take steps to keep other people from getting infected. Ask the healthcare provider to call the local or state health department.  Limit the number of people who have contact with the patient  If possible, have only one caregiver for the patient.   Other household members should stay in another home or place of residence. If this is not possible, they should stay  in another room, or be separated from the patient as much as possible. Use a separate bathroom, if available.  Restrict visitors who do not have an essential need to be in the home.  Keep older adults, very young children, and other sick people away from the patient Keep older adults, very young children, and those who have compromised immune systems or chronic health conditions away from the patient. This includes people with chronic heart, lung, or kidney conditions, diabetes, and cancer.  Ensure good ventilation Make sure that shared spaces in the home have good air flow, such as from an air conditioner or an opened window, weather permitting.  Wash your hands often  Wash your hands often and thoroughly with soap and water for at least 20 seconds. You can use an alcohol based hand sanitizer if soap and water are not available and if your hands are not visibly dirty.  Avoid touching your eyes, nose, and mouth with unwashed hands.  Use disposable paper towels to dry your hands. If not available, use dedicated cloth towels and replace them when they become wet.  Wear a facemask and gloves  Wear a disposable facemask at all times in the room and gloves when you touch or have contact with the patient's blood, body fluids, and/or secretions or excretions, such as sweat, saliva, sputum, nasal mucus, vomit, urine, or feces.  Ensure the mask fits over your nose and mouth tightly, and do not touch it during use.  Throw out disposable facemasks and gloves after using them. Do not reuse.  Wash your hands immediately after removing your facemask and gloves.  If your personal clothing becomes contaminated, carefully remove clothing and launder. Wash your hands after handling contaminated clothing.  Place all used disposable facemasks, gloves, and other waste in a lined container  before disposing them with other household waste.  Remove gloves and wash your hands immediately after handling these items.  Do not share dishes, glasses, or other household items with the patient  Avoid sharing household items. You should not share dishes, drinking glasses, cups, eating utensils, towels, bedding, or other items with a patient who is confirmed to have, or being evaluated for, COVID-19 infection.  After the person uses these items, you should wash them thoroughly with soap and water.  Wash laundry thoroughly  Immediately remove and wash clothes or bedding that have blood, body fluids, and/or secretions or excretions, such as sweat, saliva, sputum, nasal mucus, vomit, urine, or feces, on them.  Wear gloves when handling laundry from the patient.  Read and follow directions on labels of laundry or clothing items and detergent. In general, wash and dry with the warmest temperatures recommended on the label.  Clean all areas the individual has used often  Clean all touchable surfaces, such as counters, tabletops, doorknobs, bathroom fixtures, toilets, phones, keyboards, tablets, and bedside tables, every day. Also, clean any surfaces that may have blood, body fluids, and/or secretions or excretions on them.  Wear gloves when cleaning surfaces the patient has come in contact with.  Use a diluted bleach solution (e.g., dilute bleach with 1 part  bleach and 10 parts water) or a household disinfectant with a label that says EPA-registered for coronaviruses. To make a bleach solution at home, add 1 tablespoon of bleach to 1 quart (4 cups) of water. For a larger supply, add  cup of bleach to 1 gallon (16 cups) of water.  Read labels of cleaning products and follow recommendations provided on product labels. Labels contain instructions for safe and effective use of the cleaning product including precautions you should take when applying the product, such as wearing gloves or eye  protection and making sure you have good ventilation during use of the product.  Remove gloves and wash hands immediately after cleaning.  Monitor yourself for signs and symptoms of illness Caregivers and household members are considered close contacts, should monitor their health, and will be asked to limit movement outside of the home to the extent possible. Follow the monitoring steps for close contacts listed on the symptom monitoring form.   ? If you have additional questions, contact your local health department or call the epidemiologist on call at [phone] (available 24/7). ? This guidance is subject to change. For the most up-to-date guidance from [facility], please refer to their website: YouBlogs.pl  This information is directly available on the CDC website: RunningShows.co.za.html    Source:CDC Reference to specific commercial products, manufacturers, companies, or trademarks does not constitute its endorsement or recommendation by the [place], [place], or Centers for [employer] and Prevention.

## 2018-11-21 NOTE — Progress Notes (Signed)
Patient: Christy Johnson Female    DOB: 22-Dec-1955   63 y.o.   MRN: [id number] Visit Date: 11/21/2018  Today's Provider: Mar Daring, PA-C   Chief Complaint  Patient presents with  . Fever   Subjective:    I,Joseline E. Rosas,RMA am acting as a Education administrator for [employer], PA-C.  Virtual Visit via Video Note  I connected with JENESE MISCHKE on 11/21/18 at  2:40 PM EDT by a video enabled telemedicine application and verified that I am speaking with the correct person using two identifiers.  Location: Patient: home Provider: [facility]   I discussed the limitations of evaluation and management by telemedicine and the availability of in person appointments. The patient expressed understanding and agreed to proceed.   Fever  Associated symptoms include headaches. Pertinent negatives include no chest pain, coughing or wheezing.   Patient with c/o fever.Reports that on Thursday she was not feeling like herself. She checked her temperature Thursday night and it was 99.8. She reports that her temp on Friday was 100.00 and on Saturday was 99-100 and around 1 pm on Saturday her temp was 101. Yesterday she woke up and she was feeling perfectly normal but in the later on she had a temperature off 100.4 Associated Symptoms: Dull headache, tickle in the back of her throat and some tender on her ears this morning. BP has been a little up but feels nervous. Not taking tylenol or IBU.  Allergies  Allergen Reactions  . Penicillins Rash     Current Outpatient Medications:  .  aspirin 81 MG EC tablet, Take 1 tablet by mouth daily., Disp: , Rfl:  .  fluticasone (FLONASE) 50 MCG/ACT nasal spray, Place 1 spray into both nostrils 2 (two) times daily., Disp: 16 g, Rfl: 6 .  lisinopril (PRINIVIL,ZESTRIL) 10 MG tablet, TAKE 1 TABLET BY MOUTH ONCE DAILY, Disp: 90 tablet, Rfl: 4 .  Multiple Vitamins tablet, Take 1 tablet by mouth daily., Disp: , Rfl:  .  Omega-3 Fatty Acids (FISH OIL) 1200 MG  CAPS, Take 1 capsule by mouth daily., Disp: , Rfl:  .  Probiotic CAPS, Take by mouth., Disp: , Rfl:  .  benzonatate (TESSALON PERLES) 100 MG capsule, Take 1-2 capsules (100-200 mg total) by mouth every 8 (eight) hours as needed for cough., Disp: 30 capsule, Rfl: 0 .  benzonatate (TESSALON) 100 MG capsule, Take 2 capsules (200 mg total) by mouth 3 (three) times daily as needed for cough., Disp: 20 capsule, Rfl: 0 .  cetirizine (ZYRTEC) 10 MG tablet, Take 1 tablet (10 mg total) by mouth daily. (Patient not taking: Reported on 11/21/2018), Disp: 30 tablet, Rfl: 11 .  fluticasone (FLONASE) 50 MCG/ACT nasal spray, Place 2 sprays into both nostrils daily., Disp: 16 g, Rfl: 0  Review of Systems  Constitutional: Positive for fever.  Respiratory: Negative for cough, chest tightness, shortness of breath and wheezing.   Cardiovascular: Negative for chest pain and palpitations.  Gastrointestinal: Negative.   Genitourinary: Negative.   Musculoskeletal: Negative.   Neurological: Positive for headaches.    Social History   Tobacco Use  . Smoking status: Never Smoker  . Smokeless tobacco: Never Used  Substance Use Topics  . Alcohol use: Not Currently    Comment: Ocassionally once a month      Objective:   BP (!) 144/76 (BP Location: Left Arm, Patient Position: Sitting, Cuff Size: Large)   Pulse 94   Temp (!) 100.4 F (38  C) (Oral) Comment: today at 12pm  LMP  (LMP Unknown)  Vitals:   11/21/18 1359  BP: (!) 144/76  Pulse: 94  Temp: (!) 100.4 F (38 C)  TempSrc: Oral     Physical Exam Vitals signs reviewed.  Constitutional:      General: She is not in acute distress.    Appearance: Normal appearance. She is well-developed. She is not ill-appearing.  HENT:     Head: Normocephalic and atraumatic.  Eyes:     General: No scleral icterus.    Extraocular Movements: Extraocular movements intact.  Neck:     Musculoskeletal: Normal range of motion and neck supple.  Pulmonary:     Effort:  Pulmonary effort is normal. No respiratory distress.  Neurological:     Mental Status: She is alert.  Psychiatric:        Mood and Affect: Mood normal.        Behavior: Behavior normal.        Thought Content: Thought content normal.        Judgment: Judgment normal.         Assessment & Plan     1. Suspected Covid-19 Virus Infection Patient has had fever since Thursday last week. No other symptoms. No known exposure. Patient is at home isolating since last Thursday. Work note will be provided. Tylenol for fevers over 101. Mucinex DM if cough and congestion develop. Call if symptoms change.  - MyChart COVID-19 home monitoring program; Future - Temperature monitoring; Future  2. Fever, unspecified fever cause See above medical treatment plan. - MyChart COVID-19 home monitoring program; Future - Temperature monitoring; Future  I discussed the assessment and treatment plan with the patient. The patient was provided an opportunity to ask questions and all were answered. The patient agreed with the plan and demonstrated an understanding of the instructions.   The patient was advised to call back or seek an in-person evaluation if the symptoms worsen or if the condition fails to improve as anticipated.  I provided 18 minutes of non-face-to-face time during this encounter.     Mar Daring, PA-C  [facility]

## 2018-11-22 ENCOUNTER — Encounter: Payer: Self-pay | Admitting: Physician Assistant

## 2018-11-22 ENCOUNTER — Encounter (INDEPENDENT_AMBULATORY_CARE_PROVIDER_SITE_OTHER): Payer: Self-pay

## 2018-11-22 ENCOUNTER — Telehealth: Payer: Self-pay | Admitting: General Practice

## 2018-11-22 ENCOUNTER — Other Ambulatory Visit: Payer: Managed Care, Other (non HMO)

## 2018-11-22 DIAGNOSIS — Z20822 Contact with and (suspected) exposure to covid-19: Secondary | ICD-10-CM

## 2018-11-22 NOTE — Telephone Encounter (Signed)
Pt has been scheduled for covid testing. Pt is scheduled for [facility] testing site.  Scheduled appt with pt directly.   Pt was referred by: Mar Daring, PA-C

## 2018-11-22 NOTE — Addendum Note (Signed)
Addended by: Denman George on: 11/22/2018 10:29 AM   Modules accepted: Orders

## 2018-11-22 NOTE — Telephone Encounter (Signed)
Patient has already been contacted.

## 2018-11-23 ENCOUNTER — Encounter (INDEPENDENT_AMBULATORY_CARE_PROVIDER_SITE_OTHER): Payer: Self-pay

## 2018-11-23 LAB — NOVEL CORONAVIRUS, NAA: SARS-CoV-2, NAA: NOT DETECTED

## 2018-11-24 ENCOUNTER — Telehealth: Payer: Self-pay

## 2018-11-24 ENCOUNTER — Encounter (INDEPENDENT_AMBULATORY_CARE_PROVIDER_SITE_OTHER): Payer: Self-pay

## 2018-11-24 NOTE — Telephone Encounter (Signed)
[facility] Courtesy call to patient re her COVID19 questionnaire responses.  Patient reports she feels her cough has worsen due to allergies. She reports having a lot of drainage, causing some nausea and has begin taking Allegra and Mucinex DM(she thinks). Patient reports having a fever yesterday - 99.9 but has remained at - 98.6 today without taking anything for the fever. Patient denies SOB, diarrhea, vomiting.    Encouraged to continue pushing fluids as tolerated, monitoring  her symptoms and staying in touch her PCP.  Patient stated she understood and would - no concerns at this time.

## 2018-11-26 ENCOUNTER — Encounter (INDEPENDENT_AMBULATORY_CARE_PROVIDER_SITE_OTHER): Payer: Self-pay

## 2018-11-27 ENCOUNTER — Encounter (INDEPENDENT_AMBULATORY_CARE_PROVIDER_SITE_OTHER): Payer: Self-pay

## 2019-09-21 ENCOUNTER — Other Ambulatory Visit: Payer: Self-pay | Admitting: Family Medicine

## 2019-11-22 NOTE — Progress Notes (Signed)
Complete physical exam   Patient: Christy Johnson   DOB: 1955-08-25   64 y.o. Female  MRN: [id number] Visit Date: 11/24/2019   Today's healthcare provider: Lelon Huh, MD   Chief Complaint  Patient presents with  . Annual Exam   Subjective    Christy Johnson is a 64 y.o. female who presents today for a complete physical exam.  She reports consuming a general diet. The patient does not participate in regular exercise at present. She generally feels well. She reports sleeping poorly. She does not have additional problems to discuss today.   HPI  Hypertension, follow-up  BP Readings from Last 3 Encounters:  11/24/19 133/74  11/21/18 (!) 144/76  01/14/18 128/84   Wt Readings from Last 3 Encounters:  11/24/19 208 lb 12.8 oz (94.7 kg)  01/14/18 209 lb (94.8 kg)  08/06/16 195 lb (88.5 kg)     She was last seen for hypertension 01/14/2018  BP at that visit was 128/84. Management since that visit includes no change.  She reports good compliance with treatment. She is not having side effects.  She is following a Regular diet. She is not exercising. She does not smoke.  Use of agents associated with hypertension: none.   Outside blood pressures are not being checked at home. Symptoms: No chest pain No chest pressure  No palpitations No syncope  No dyspnea No orthopnea  No paroxysmal nocturnal dyspnea No lower extremity edema   Pertinent labs: Lab Results  Component Value Date   CHOL 198 01/14/2018   HDL 50 01/14/2018   LDLCALC 129 (H) 01/14/2018   TRIG 96 01/14/2018   CHOLHDL 4.0 01/14/2018   Lab Results  Component Value Date   NA 138 01/14/2018   K 4.8 01/14/2018   CREATININE 0.79 01/14/2018   GFRNONAA 81 01/14/2018   GFRAA 93 01/14/2018   GLUCOSE 95 01/14/2018     The 10-year ASCVD risk score Mikey Bussing DC Jr., et al., 2013) is: 6.8%   ---------------------------------------------------------------------------------------------------   Past Medical  History:  Diagnosis Date  . 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 BLADDER REMOVAL  2003   Removed from Left Ovary  . Surgery on DVT fron stent put in  2008   Social History   Socioeconomic History  . Marital status: Married    Spouse name: Not on file  . Number of children: 3  . Years of education: Not on file  . Highest education level: Not on file  Occupational History  . Occupation: clinical team coordinator    Employer: [employer]OS[employer]  Tobacco Use  . Smoking status: Never Smoker  . Smokeless tobacco: Never Used  Substance and Sexual Activity  . Alcohol use: Not Currently    Comment: Ocassionally once a month  . Drug use: No  . Sexual activity: Not on file  Other Topics Concern  . Not on file  Social History Narrative  . Not on file   Social Determinants of Health   Financial Resource Strain:   . Difficulty of Paying Living Expenses:   Food Insecurity:   . Worried About Charity fundraiser in the Last Year:   . Arboriculturist in the Last Year:   Transportation Needs:   . Film/video editor (Medical):   Marland Kitchen Lack of Transportation (Non-Medical):   Physical Activity:   .  Days of Exercise per Week:   . Minutes of Exercise per Session:   Stress:   . Feeling of Stress :   Social Connections:   . Frequency of Communication with Friends and Family:   . Frequency of Social Gatherings with Friends and Family:   . Attends Religious Services:   . Active Member of Clubs or Organizations:   . Attends Archivist Meetings:   Marland Kitchen Marital Status:   Intimate Partner Violence:   . Fear of Current or Ex-Partner:   . Emotionally Abused:   Marland Kitchen Physically Abused:   . Sexually Abused:    Family Status  Relation Name Status  . Mother  Alive  . Father  Alive  . Sister  Alive       Parathyroid  . Cousin  (Not Specified)  . Son  (Not Specified)  .  Neg Hx  (Not Specified)   Family History  Problem Relation Age of Onset  . Hypertension Mother   . Hypertension Father   . Cerebrovascular Accident Father   . Thyroid cancer Cousin   . Melanoma Son   . Breast cancer Neg Hx    Allergies  Allergen Reactions  . Penicillins Rash    Patient Care Team: Birdie Sons, MD as PCP - General (Family Medicine)   Medications: Outpatient Medications Prior to Visit  Medication Sig  . aspirin 81 MG EC tablet Take 1 tablet by mouth daily.  . fluticasone (FLONASE) 50 MCG/ACT nasal spray Place 2 sprays into both nostrils daily.  Marland Kitchen lisinopril (ZESTRIL) 10 MG tablet Take 1 tablet by mouth once daily  . Multiple Vitamins tablet Take 1 tablet by mouth daily.  . Multiple Vitamins-Minerals (ZINC PO) Take by mouth.  . Omega-3 Fatty Acids (FISH OIL) 1200 MG CAPS Take 1 capsule by mouth daily.  . Probiotic CAPS Take by mouth.  . [DISCONTINUED] benzonatate (TESSALON PERLES) 100 MG capsule Take 1-2 capsules (100-200 mg total) by mouth every 8 (eight) hours as needed for cough.  . [DISCONTINUED] benzonatate (TESSALON) 100 MG capsule Take 2 capsules (200 mg total) by mouth 3 (three) times daily as needed for cough.  . [DISCONTINUED] cetirizine (ZYRTEC) 10 MG tablet Take 1 tablet (10 mg total) by mouth daily. (Patient not taking: Reported on 11/21/2018)  . [DISCONTINUED] fluticasone (FLONASE) 50 MCG/ACT nasal spray Place 1 spray into both nostrils 2 (two) times daily.   No facility-administered medications prior to visit.    Review of Systems  Constitutional: Negative for appetite change, chills, fatigue and fever.  Respiratory: Negative for chest tightness and shortness of breath.   Cardiovascular: Negative for chest pain and palpitations.  Gastrointestinal: Negative for abdominal pain, nausea and vomiting.  Neurological: Negative for dizziness and weakness.     Objective    BP 133/74 (BP Location: Right Arm, Patient Position: Sitting, Cuff Size:  Large)   Pulse 79   Temp (!) 97.1 F (36.2 C) (Temporal)   Ht 5\' 1"  (1.549 m)   Wt 208 lb 12.8 oz (94.7 kg)   LMP  (LMP Unknown)   BMI 39.45 kg/m   Physical Exam  BP 133/74 (BP Location: Right Arm, Patient Position: Sitting, Cuff Size: Large)   Pulse 79   Temp (!) 97.1 F (36.2 C) (Temporal)   Ht 5\' 1"  (1.549 m)   Wt 208 lb 12.8 oz (94.7 kg)   LMP  (LMP Unknown)   BMI 39.45 kg/m    General Appearance:    Obese female. Alert,  cooperative, in no acute distress, appears stated age   Head:    Normocephalic, without obvious abnormality, atraumatic  Eyes:    PERRL, conjunctiva/corneas clear, EOM's intact, fundi    benign, both eyes  Ears:    Normal TM's and external ear canals, both ears  Nose:   Nares normal, septum midline, mucosa normal, no drainage    or sinus tenderness  Throat:   Lips, mucosa, and tongue normal; teeth and gums normal  Neck:   Supple, symmetrical, trachea midline, no adenopathy;    thyroid:  no enlargement/tenderness/nodules; no carotid   bruit or JVD  Back:     Symmetric, no curvature, ROM normal, no CVA tenderness  Lungs:     Clear to auscultation bilaterally, respirations unlabored  Chest Wall:    No tenderness or deformity   Heart:    Normal heart rate. Normal rhythm. No murmurs, rubs, or gallops.   Breast Exam:    normal appearance, no masses or tenderness  Abdomen:     Soft, non-tender, bowel sounds active all four quadrants,    no masses, no organomegaly  Pelvic:    cervix normal in appearance, external genitalia normal, no cervical motion tenderness and urethra without abnormality or discharge  Extremities:   All extremities are intact. No cyanosis or edema  Pulses:   2+ and symmetric all extremities  Skin:   Skin color, texture, turgor normal, no rashes or lesions  Lymph nodes:   Cervical, supraclavicular, and axillary nodes normal  Neurologic:   CNII-XII intact, normal strength, sensation and reflexes    throughout     Depression Screen  PHQ  2/9 Scores 11/24/2019 01/14/2018 08/06/2016  PHQ - 2 Score 1 0 0  PHQ- 9 Score - 6 2    No results found for any visits on 11/24/19.  Assessment & Plan    Routine Health Maintenance and Physical Exam  Exercise Activities and Dietary recommendations Goals   None     Immunization History  Administered Date(s) Administered  . Td 08/06/2016  . Tdap 02/11/2006    Health Maintenance  Topic Date Due  . COVID-19 Vaccine (1) Never done  . HIV Screening  Never done  . PAP SMEAR-Modifier  08/07/2019  . COLONOSCOPY  10/23/2019  . INFLUENZA VACCINE  01/07/2020  . MAMMOGRAM  01/27/2020  . TETANUS/TDAP  08/07/2026  . Hepatitis C Screening  Completed     1. Annual physical exam   2. Obesity (BMI 30.0-34.9)   3. Hypertension, essential Well controlled.  Continue current medications.   - Lipid Profile - CBC with Differential - Comprehensive Metabolic Panel (CMET) - TSH  4. Encounter for screening for HIV  - HIV Antibody (routine testing w rflx)  5. Breast cancer screening by mammogram  - MM 3D Screening Bilateral; Future  6. Cervical cancer screening  - Cytology - HPV w/PAP any interpr. 16/18 genotyping if + HPV (reflex) ([facility])  7. Colon cancer screening She has received letter from [facility] to schedule routine colonoscopy. She will let me know if she needs referral.   Counseled on risks and benefits of covid vaccine versus not being vaccinated.       The entirety of the information documented in the History of Present Illness, Review of Systems and Physical Exam were personally obtained by me. Portions of this information were initially documented by the CMA and reviewed by me for thoroughness and accuracy.      Lelon Huh, MD  [facility] [phone]  phone) [phone] (fax)  [place]

## 2019-11-24 ENCOUNTER — Other Ambulatory Visit: Payer: Self-pay

## 2019-11-24 ENCOUNTER — Other Ambulatory Visit: Payer: Self-pay | Admitting: Family Medicine

## 2019-11-24 ENCOUNTER — Encounter: Payer: Self-pay | Admitting: Family Medicine

## 2019-11-24 ENCOUNTER — Ambulatory Visit (INDEPENDENT_AMBULATORY_CARE_PROVIDER_SITE_OTHER): Payer: Commercial Managed Care - PPO | Admitting: Family Medicine

## 2019-11-24 VITALS — BP 133/74 | HR 79 | Temp 97.1°F | Ht 61.0 in | Wt 208.8 lb

## 2019-11-24 DIAGNOSIS — Z0000 Encounter for general adult medical examination without abnormal findings: Secondary | ICD-10-CM

## 2019-11-24 DIAGNOSIS — Z1211 Encounter for screening for malignant neoplasm of colon: Secondary | ICD-10-CM

## 2019-11-24 DIAGNOSIS — Z1231 Encounter for screening mammogram for malignant neoplasm of breast: Secondary | ICD-10-CM

## 2019-11-24 DIAGNOSIS — E669 Obesity, unspecified: Secondary | ICD-10-CM

## 2019-11-24 DIAGNOSIS — I10 Essential (primary) hypertension: Secondary | ICD-10-CM

## 2019-11-24 DIAGNOSIS — Z114 Encounter for screening for human immunodeficiency virus [HIV]: Secondary | ICD-10-CM | POA: Diagnosis not present

## 2019-11-24 DIAGNOSIS — Z124 Encounter for screening for malignant neoplasm of cervix: Secondary | ICD-10-CM

## 2019-11-24 NOTE — Patient Instructions (Signed)
.   Please review the attached list of medications and notify my office if there are any errors.   . Please bring all of your medications to every appointment so we can make sure that our medication list is the same as yours.    The CDC recommends two doses of Shingrix (the shingles vaccine) separated by 2 to 6 months for adults age 63 years and older. I recommend checking with your pharmacy plan regarding coverage for this vaccine.     

## 2019-11-25 LAB — COMPREHENSIVE METABOLIC PANEL
ALT: 25 IU/L (ref 0–32)
AST: 21 IU/L (ref 0–40)
Albumin/Globulin Ratio: 1.5 (ref 1.2–2.2)
Albumin: 4.6 g/dL (ref 3.8–4.8)
Alkaline Phosphatase: 98 IU/L (ref 48–121)
BUN/Creatinine Ratio: 18 (ref 12–28)
BUN: 15 mg/dL (ref 8–27)
Bilirubin Total: 0.4 mg/dL (ref 0.0–1.2)
CO2: 23 mmol/L (ref 20–29)
Calcium: 9.7 mg/dL (ref 8.7–10.3)
Chloride: 103 mmol/L (ref 96–106)
Creatinine, Ser: 0.82 mg/dL (ref 0.57–1.00)
GFR calc Af Amer: 88 mL/min/{1.73_m2} (ref >59)
GFR calc non Af Amer: 76 mL/min/{1.73_m2} (ref >59)
Globulin, Total: 3 g/dL (ref 1.5–4.5)
Glucose: 85 mg/dL (ref 65–99)
Potassium: 4.8 mmol/L (ref 3.5–5.2)
Sodium: 142 mmol/L (ref 134–144)
Total Protein: 7.6 g/dL (ref 6.0–8.5)

## 2019-11-25 LAB — CBC WITH DIFFERENTIAL/PLATELET
Basophils Absolute: 0.1 10*3/uL (ref 0.0–0.2)
Basos: 1 %
EOS (ABSOLUTE): 0.2 10*3/uL (ref 0.0–0.4)
Eos: 3 %
Hematocrit: 39.3 % (ref 34.0–46.6)
Hemoglobin: 13.2 g/dL (ref 11.1–15.9)
Immature Grans (Abs): 0 10*3/uL (ref 0.0–0.1)
Immature Granulocytes: 0 %
Lymphocytes Absolute: 3 10*3/uL (ref 0.7–3.1)
Lymphs: 34 %
MCH: 28.4 pg (ref 26.6–33.0)
MCHC: 33.6 g/dL (ref 31.5–35.7)
MCV: 85 fL (ref 79–97)
Monocytes Absolute: 0.6 10*3/uL (ref 0.1–0.9)
Monocytes: 7 %
Neutrophils Absolute: 5 10*3/uL (ref 1.4–7.0)
Neutrophils: 55 %
Platelets: 385 10*3/uL (ref 150–450)
RBC: 4.64 x10E6/uL (ref 3.77–5.28)
RDW: 13.1 % (ref 11.7–15.4)
WBC: 8.9 10*3/uL (ref 3.4–10.8)

## 2019-11-25 LAB — LIPID PANEL
Chol/HDL Ratio: 4 ratio (ref 0.0–4.4)
Cholesterol, Total: 218 mg/dL — ABNORMAL HIGH (ref 100–199)
HDL: 55 mg/dL (ref >39)
LDL Chol Calc (NIH): 144 mg/dL — ABNORMAL HIGH (ref 0–99)
Triglycerides: 105 mg/dL (ref 0–149)
VLDL Cholesterol Cal: 19 mg/dL (ref 5–40)

## 2019-11-25 LAB — TSH: TSH: 3.17 u[IU]/mL (ref 0.450–4.500)

## 2019-11-25 LAB — HIV ANTIBODY (ROUTINE TESTING W REFLEX): HIV Screen 4th Generation wRfx: NONREACTIVE

## 2019-11-29 ENCOUNTER — Encounter: Payer: Self-pay | Admitting: Family Medicine

## 2019-11-30 LAB — IGP, APTIMA HPV, RFX 16/18,45: HPV Aptima: NEGATIVE

## 2019-11-30 LAB — GYN REPORT: PAP Smear Comment: 0

## 2019-12-28 ENCOUNTER — Other Ambulatory Visit: Payer: Self-pay | Admitting: Family Medicine

## 2020-02-15 DIAGNOSIS — L089 Local infection of the skin and subcutaneous tissue, unspecified: Secondary | ICD-10-CM | POA: Diagnosis not present

## 2020-02-20 ENCOUNTER — Other Ambulatory Visit: Payer: Self-pay

## 2020-02-20 ENCOUNTER — Ambulatory Visit
Admission: RE | Admit: 2020-02-20 | Discharge: 2020-02-20 | Disposition: A | Discharge status: Home / Self Care | Payer: BC Managed Care – PPO | Source: Ambulatory Visit | Attending: Family Medicine | Admitting: Family Medicine

## 2020-02-20 DIAGNOSIS — Z1231 Encounter for screening mammogram for malignant neoplasm of breast: Secondary | ICD-10-CM | POA: Diagnosis not present

## 2020-03-27 IMAGING — MG MM DIGITAL SCREENING BILAT W/ TOMO W/ CAD
6 of 10 series · 6 of 30 positions shown · non-contrast
Comparison: Previous exam(s).

CLINICAL DATA: Screening.

EXAM:
DIGITAL SCREENING BILATERAL MAMMOGRAM WITH TOMO AND CAD

[L MLO synth-2D (1 of 2)]
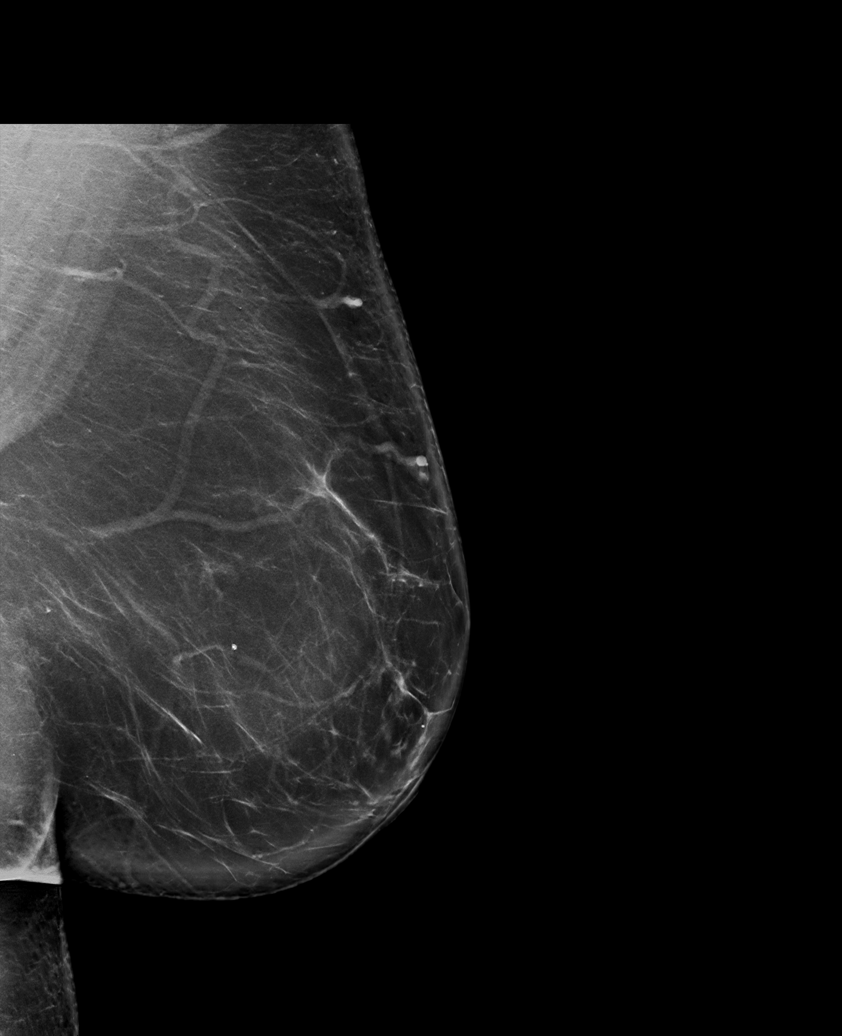

[L CC synth-2D]
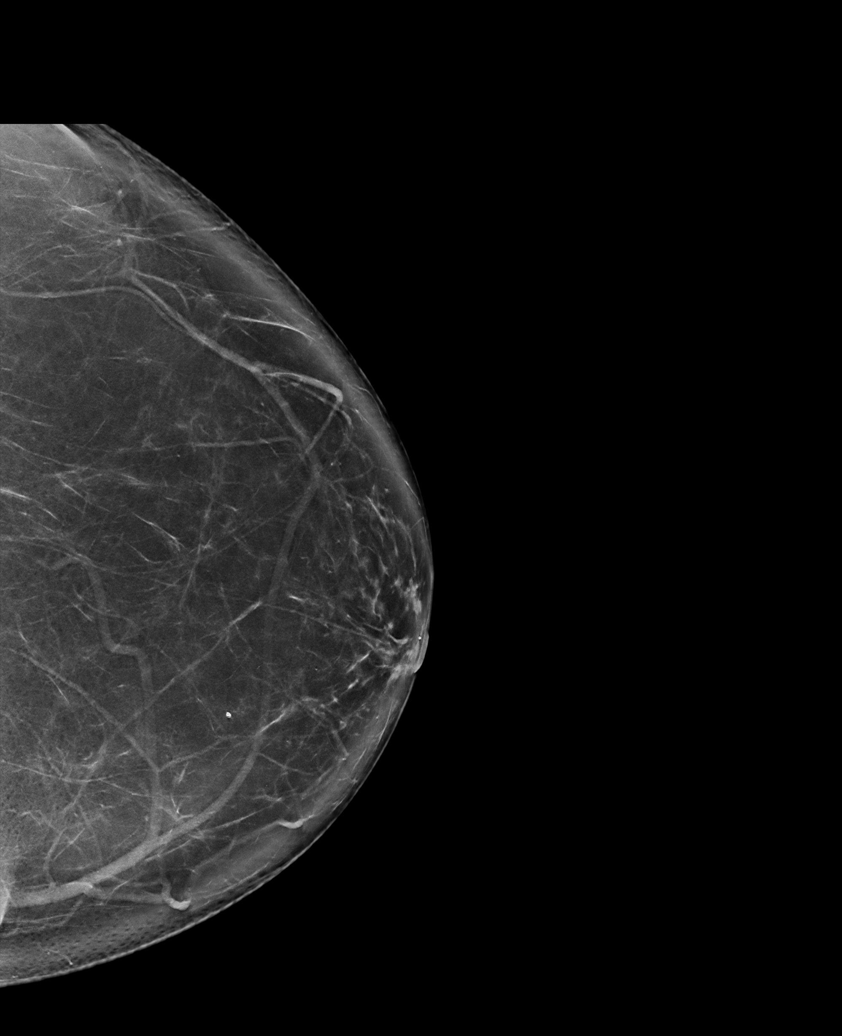

[R MLO synth-2D]
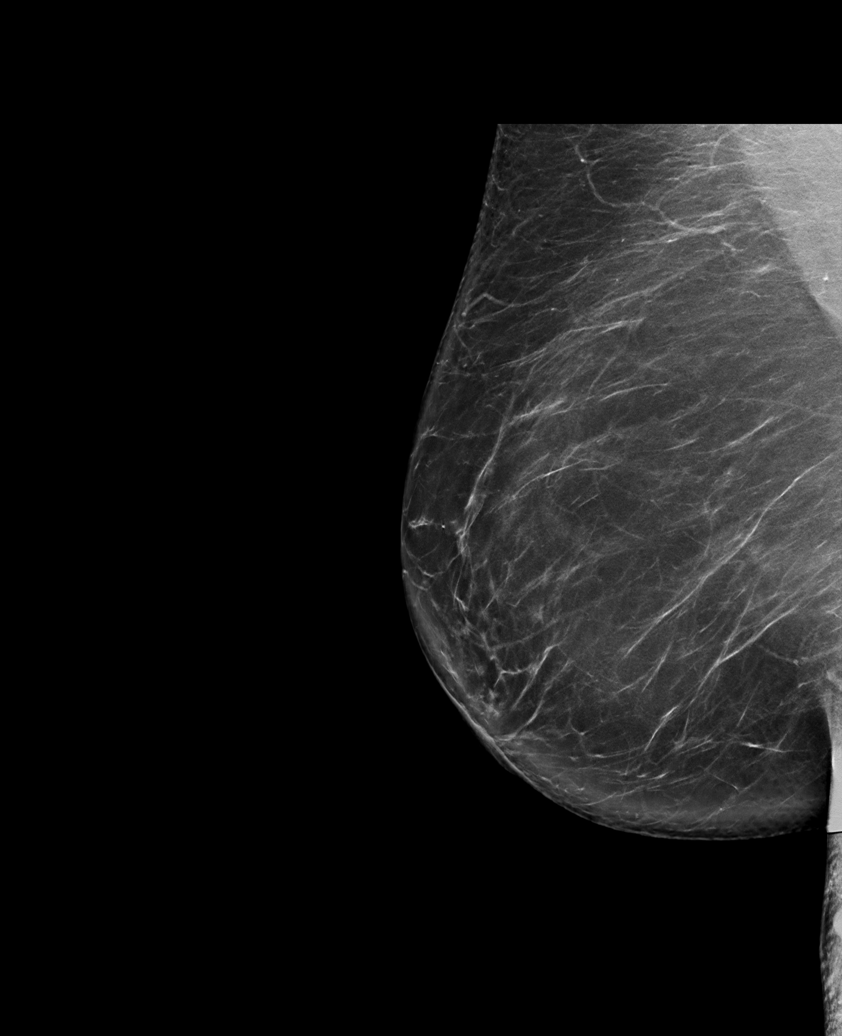

[L MLO synth-2D (2 of 2)]
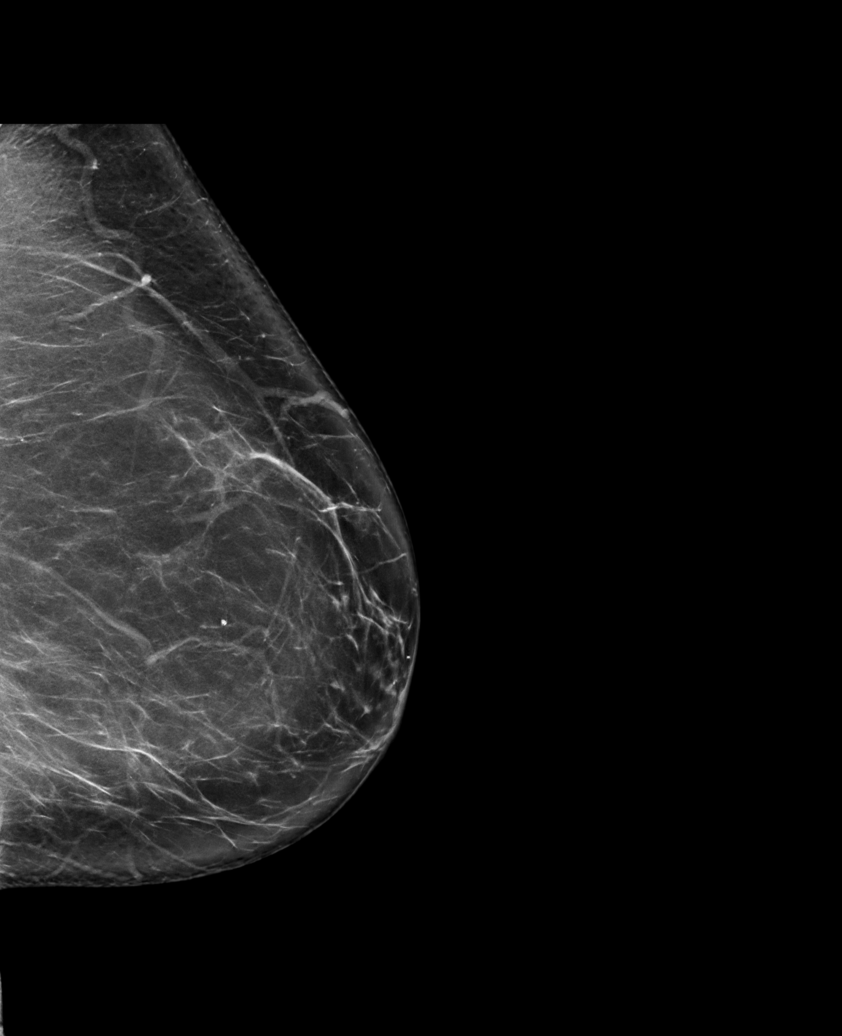

[R CC synth-2D]
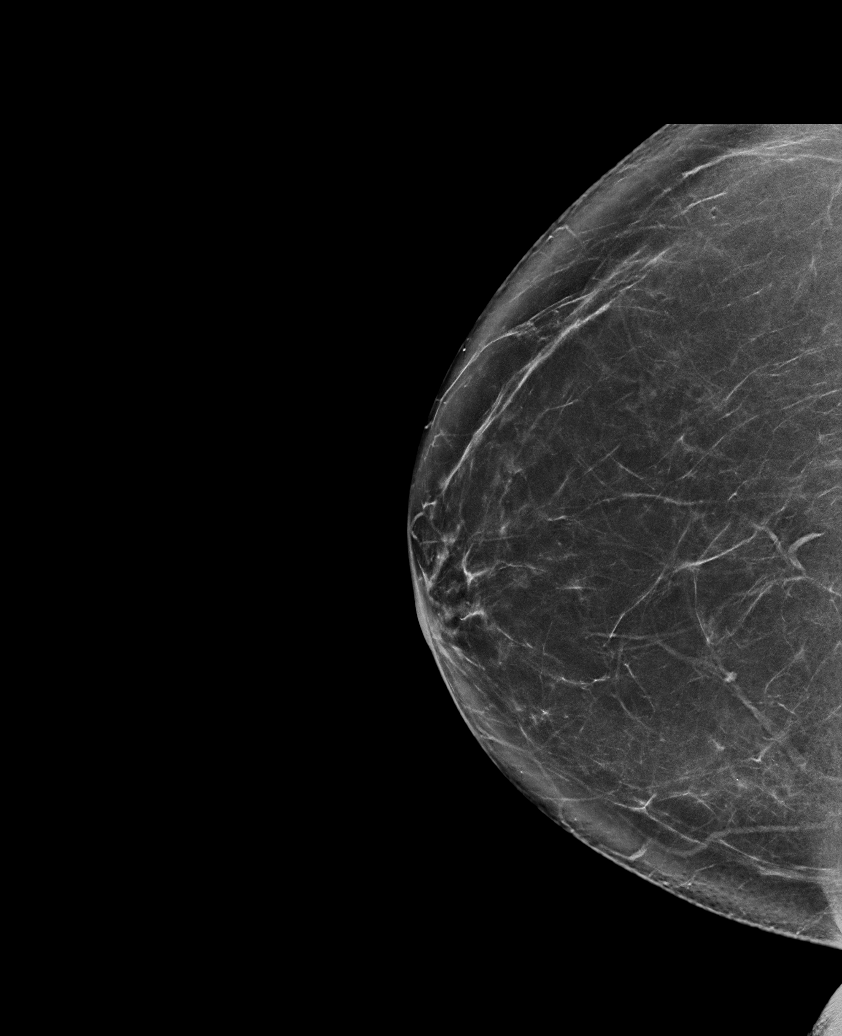

[L MLO tomo · tomo slice 54/107.0]
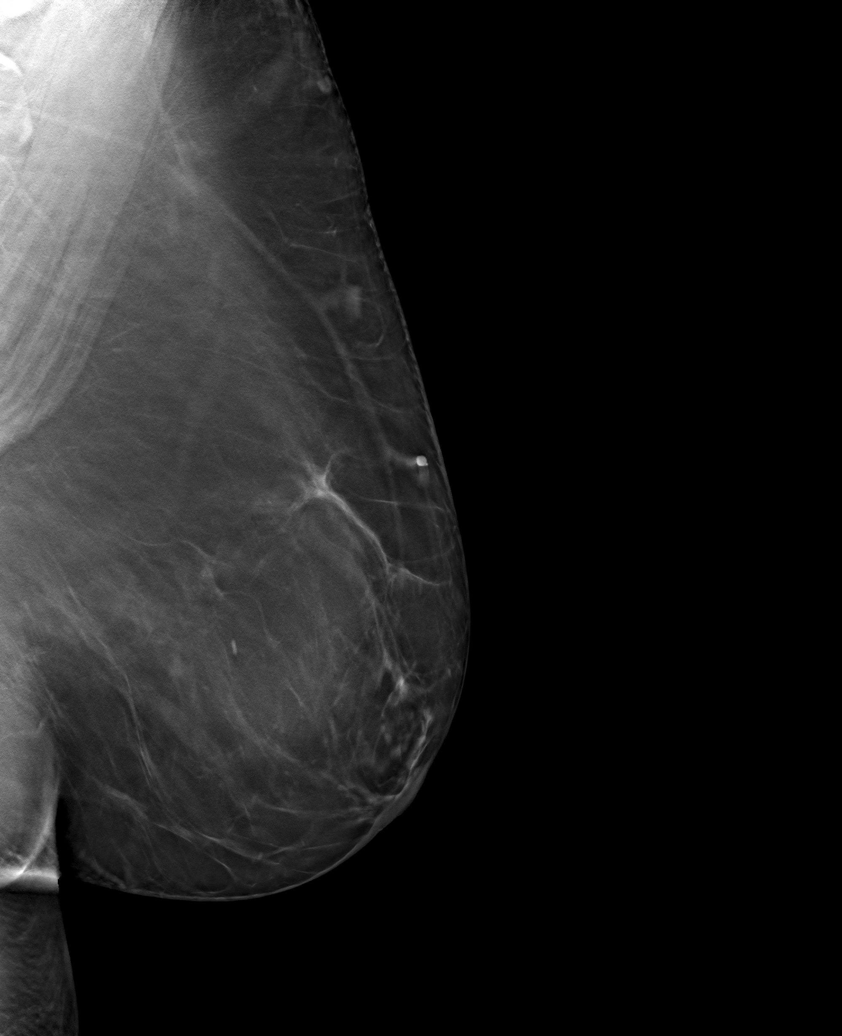

[6 of 30 positions shown; findings below may reference images not displayed]

ACR Breast Density Category b: There are scattered areas of
fibroglandular density.
FINDINGS: There are no findings suspicious for malignancy. Images were
processed with CAD.
IMPRESSION: No mammographic evidence of malignancy. A result letter of this
screening mammogram will be mailed directly to the patient.

RECOMMENDATION:
Screening mammogram in one year. (Code:[id number])

BI-RADS CATEGORY  1: Negative.

## 2020-06-04 DIAGNOSIS — D225 Melanocytic nevi of trunk: Secondary | ICD-10-CM | POA: Diagnosis not present

## 2020-06-04 DIAGNOSIS — Z85820 Personal history of malignant melanoma of skin: Secondary | ICD-10-CM | POA: Diagnosis not present

## 2020-06-04 DIAGNOSIS — D2261 Melanocytic nevi of right upper limb, including shoulder: Secondary | ICD-10-CM | POA: Diagnosis not present

## 2020-06-04 DIAGNOSIS — B001 Herpesviral vesicular dermatitis: Secondary | ICD-10-CM | POA: Diagnosis not present

## 2020-08-14 ENCOUNTER — Other Ambulatory Visit: Payer: Self-pay | Admitting: Family Medicine

## 2020-08-14 NOTE — Telephone Encounter (Signed)
Courtesy refill. Patient needs appointment. Requested Prescriptions  Pending Prescriptions Disp Refills  . lisinopril (ZESTRIL) 10 MG tablet [Pharmacy Med Name: Lisinopril 10 MG Oral Tablet] 30 tablet 0    Sig: Take 1 tablet by mouth once daily     Cardiovascular:  ACE Inhibitors Failed - 08/14/2020  6:52 AM      Failed - Cr in normal range and within 180 days    Creatinine, Ser  Date Value Ref Range Status  11/24/2019 0.82 0.57 - 1.00 mg/dL Final         Failed - K in normal range and within 180 days    Potassium  Date Value Ref Range Status  11/24/2019 4.8 3.5 - 5.2 mmol/L Final         Failed - Valid encounter within last 6 months    Recent Outpatient Visits          8 months ago Annual physical exam   [facility] Birdie Sons, MD   1 year ago Suspected Covid-19 Virus Infection   [facility] [place], [place]rel, [place]   2 years ago Annual physical exam   [facility] Birdie Sons, MD   4 years ago Annual physical exam   [facility] Birdie Sons, MD   5 years ago Hypertension, essential   [facility] Birdie Sons, MD             Passed - Patient is not pregnant      Passed - Last BP in normal range    BP Readings from Last 1 Encounters:  11/24/19 133/74

## 2020-08-18 ENCOUNTER — Encounter: Payer: Self-pay | Admitting: Family Medicine

## 2020-08-18 DIAGNOSIS — I10 Essential (primary) hypertension: Secondary | ICD-10-CM

## 2020-08-19 MED ORDER — LISINOPRIL 10 MG PO TABS: 10.0000 mg | ORAL_TABLET | Freq: Every day | ORAL | 0 refills | Status: DC

## 2020-08-19 NOTE — Telephone Encounter (Signed)
Please advise if OK to send in 90 day supply. Courtesy refill was given on 08/14/2020. Patients last office visit was her CPE on 11/24/2019. No upcomming appointments are scheduled yet.

## 2020-12-10 ENCOUNTER — Encounter: Payer: Commercial Managed Care - PPO | Admitting: Family Medicine

## 2021-01-07 DIAGNOSIS — B001 Herpesviral vesicular dermatitis: Secondary | ICD-10-CM | POA: Diagnosis not present

## 2021-01-07 DIAGNOSIS — D225 Melanocytic nevi of trunk: Secondary | ICD-10-CM | POA: Diagnosis not present

## 2021-01-07 DIAGNOSIS — D2262 Melanocytic nevi of left upper limb, including shoulder: Secondary | ICD-10-CM | POA: Diagnosis not present

## 2021-01-07 DIAGNOSIS — Z85820 Personal history of malignant melanoma of skin: Secondary | ICD-10-CM | POA: Diagnosis not present

## 2021-01-08 ENCOUNTER — Encounter: Payer: Self-pay | Admitting: Family Medicine

## 2021-01-08 ENCOUNTER — Ambulatory Visit: Payer: Commercial Managed Care - PPO | Admitting: Family Medicine

## 2021-01-08 ENCOUNTER — Other Ambulatory Visit: Payer: Self-pay

## 2021-01-08 VITALS — BP 120/56 | HR 74 | Ht 62.0 in | Wt 185.0 lb

## 2021-01-08 DIAGNOSIS — I10 Essential (primary) hypertension: Secondary | ICD-10-CM

## 2021-01-08 DIAGNOSIS — Z1211 Encounter for screening for malignant neoplasm of colon: Secondary | ICD-10-CM | POA: Diagnosis not present

## 2021-01-08 MED ORDER — LISINOPRIL 10 MG PO TABS: 10.0000 mg | ORAL_TABLET | Freq: Every day | ORAL | 0 refills | Status: DC

## 2021-01-08 NOTE — Patient Instructions (Addendum)
Please review the attached list of medications and notify my office if there are any errors.   Please call [facility] to schedule your routine colonoscopy. Alternatively, you can call our office for a referral to [place] for your colonoscopy

## 2021-01-08 NOTE — Progress Notes (Signed)
      Established patient visit   Patient: Christy Johnson   DOB: [birth year]   65 y.o. Female  MRN: [id number] Visit Date: 01/08/2021  Today's healthcare provider: Lelon Huh, MD   Chief Complaint  Patient presents with   Hypertension   Subjective    HPI  Hypertension, follow-up  BP Readings from Last 3 Encounters:  11/24/19 133/74  11/21/18 (!) 144/76  01/14/18 128/84   Wt Readings from Last 3 Encounters:  01/08/21 185 lb (83.9 kg)  11/24/19 208 lb 12.8 oz (94.7 kg)  01/14/18 209 lb (94.8 kg)     She was last seen for hypertension 6/18/202.  BP at that visit was 133/74. Management since that visit includes; Well controlled.  Continue current medications.   She reports excellent compliance with treatment. She is not having side effects.  She is exercising. She is adherent to low salt diet.   Outside blood pressures are normal at home.  She does not smoke.  Use of agents associated with hypertension: none.   ---------------------------------------------------------------------------------------------------      Medications: Outpatient Medications Prior to Visit  Medication Sig   aspirin 81 MG EC tablet Take 1 tablet by mouth daily.   lisinopril (ZESTRIL) 10 MG tablet Take 1 tablet (10 mg total) by mouth daily.   Multiple Vitamins tablet Take 1 tablet by mouth daily.   Multiple Vitamins-Minerals (ZINC PO) Take by mouth.   Omega-3 Fatty Acids (FISH OIL) 1200 MG CAPS Take 1 capsule by mouth daily.   Probiotic CAPS Take by mouth.   fluticasone (FLONASE) 50 MCG/ACT nasal spray Place 2 sprays into both nostrils daily. (Patient not taking: Reported on 01/08/2021)   No facility-administered medications prior to visit.    Review of Systems  Constitutional: Negative.  Negative for appetite change, chills, fatigue and fever.  Respiratory: Negative.  Negative for chest tightness and shortness of breath.   Cardiovascular: Negative.  Negative for chest pain and  palpitations.  Gastrointestinal: Negative.  Negative for abdominal pain, nausea and vomiting.  Neurological:  Negative for dizziness, speech difficulty, weakness, light-headedness and headaches.      Objective    Ht '5\' 2"'$  (1.575 m)   Wt 185 lb (83.9 kg)   LMP  (LMP Unknown)   BMI 33.84 kg/m   Physical Exam  General appearance: Mildly obese female, cooperative and in no acute distress Head: Normocephalic, without obvious abnormality, atraumatic Respiratory: Respirations even and unlabored, normal respiratory rate Extremities: All extremities are intact.  Skin: Skin color, texture, turgor normal. No rashes seen  Psych: Appropriate mood and affect. Neurologic: Mental status: Alert, oriented to person, place, and time, thought content appropriate.     Assessment & Plan     1. Hypertension, essential  - Comprehensive metabolic panel - CBC - Lipid panel - lisinopril (ZESTRIL) 10 MG tablet; Take 1 tablet (10 mg total) by mouth daily.  Dispense: 90 tablet; Refill: 0  2. Colon cancer screening Reminded to contact [facility] to schedule, or call for referral to [facility]   Follow up for yearly CPE in the next 2-3 months.      The entirety of the information documented in the History of Present Illness, Review of Systems and Physical Exam were personally obtained by me. Portions of this information were initially documented by the CMA and reviewed by me for thoroughness and accuracy.     Lelon Huh, MD  [facility] [phone] (phone) [phone] (fax)  [place]

## 2021-01-09 LAB — COMPREHENSIVE METABOLIC PANEL
ALT: 18 IU/L (ref 0–32)
AST: 19 IU/L (ref 0–40)
Albumin/Globulin Ratio: 1.9 (ref 1.2–2.2)
Albumin: 4.5 g/dL (ref 3.8–4.8)
Alkaline Phosphatase: 100 IU/L (ref 44–121)
BUN/Creatinine Ratio: 18 (ref 12–28)
BUN: 14 mg/dL (ref 8–27)
Bilirubin Total: 0.5 mg/dL (ref 0.0–1.2)
CO2: 24 mmol/L (ref 20–29)
Calcium: 9.3 mg/dL (ref 8.7–10.3)
Chloride: 105 mmol/L (ref 96–106)
Creatinine, Ser: 0.8 mg/dL (ref 0.57–1.00)
Globulin, Total: 2.4 g/dL (ref 1.5–4.5)
Glucose: 96 mg/dL (ref 65–99)
Potassium: 4.4 mmol/L (ref 3.5–5.2)
Sodium: 141 mmol/L (ref 134–144)
Total Protein: 6.9 g/dL (ref 6.0–8.5)
eGFR: 82 mL/min/{1.73_m2} (ref >59)

## 2021-01-09 LAB — CBC
Hematocrit: 38.6 % (ref 34.0–46.6)
Hemoglobin: 13.1 g/dL (ref 11.1–15.9)
MCH: 29 pg (ref 26.6–33.0)
MCHC: 33.9 g/dL (ref 31.5–35.7)
MCV: 86 fL (ref 79–97)
Platelets: 339 10*3/uL (ref 150–450)
RBC: 4.51 x10E6/uL (ref 3.77–5.28)
RDW: 12.6 % (ref 11.7–15.4)
WBC: 6.2 10*3/uL (ref 3.4–10.8)

## 2021-01-09 LAB — LIPID PANEL
Chol/HDL Ratio: 3.6 ratio (ref 0.0–4.4)
Cholesterol, Total: 176 mg/dL (ref 100–199)
HDL: 49 mg/dL (ref >39)
LDL Chol Calc (NIH): 113 mg/dL — ABNORMAL HIGH (ref 0–99)
Triglycerides: 77 mg/dL (ref 0–149)
VLDL Cholesterol Cal: 14 mg/dL (ref 5–40)

## 2021-02-15 ENCOUNTER — Other Ambulatory Visit: Payer: Self-pay

## 2021-02-15 ENCOUNTER — Ambulatory Visit
Admission: RE | Admit: 2021-02-15 | Discharge: 2021-02-15 | Disposition: A | Discharge status: Home / Self Care | Payer: BC Managed Care – PPO | Source: Ambulatory Visit | Attending: Emergency Medicine | Admitting: Emergency Medicine

## 2021-02-15 VITALS — BP 148/74 | HR 78 | Temp 98.6°F | Resp 16

## 2021-02-15 DIAGNOSIS — H6692 Otitis media, unspecified, left ear: Secondary | ICD-10-CM

## 2021-02-15 DIAGNOSIS — T162XXA Foreign body in left ear, initial encounter: Secondary | ICD-10-CM | POA: Diagnosis not present

## 2021-02-15 DIAGNOSIS — H6122 Impacted cerumen, left ear: Secondary | ICD-10-CM

## 2021-02-15 MED ORDER — DOXYCYCLINE HYCLATE 100 MG PO CAPS: 100.0000 mg | ORAL_CAPSULE | Freq: Two times a day (BID) | ORAL | 0 refills | Status: DC

## 2021-02-15 NOTE — ED Triage Notes (Signed)
Pt here with left ear pressure and hearing interruption x 1 week.

## 2021-02-15 NOTE — ED Provider Notes (Signed)
Roderic Palau    CSN: [id number] Arrival date & time: 02/15/21  1233      History   Chief Complaint Chief Complaint  Patient presents with   Otalgia    HPI Christy Johnson is a 65 y.o. female.  Patient presents with pressure and decreased hearing in her left ear x1 week.  Treatment attempted at home with a Q-tip which made the pressure worse.  She denies ear drainage, fever, chills, sore throat, cough, shortness of breath, or other symptoms.  Her medical history includes hypertension, obesity, insomnia, DVT.  The history is provided by the patient and medical records.   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 liac stent placed     Patient Active Problem List   Diagnosis Date Noted   Allergic rhinitis 01/14/2018   History of chicken pox 02/28/2015   Stress bladder incontinence, female 02/28/2015   Obesity (BMI 30.0-34.9) 07/29/2009   Insomnia 09/26/2007   Hypertension, essential 01/06/2006    Past Surgical History:  Procedure Laterality Date   BLADDER REMOVAL  2003   Removed from Left Ovary   Surgery on DVT fron stent put in  2008    OB History     Gravida  2   Para  2   Term      Preterm      AB      Living         SAB      IAB      Ectopic      Multiple      Live Births           Obstetric Comments  G2P3          Home Medications    Prior to Admission medications   Medication Sig Start Date End Date Taking? Authorizing Provider  doxycycline (VIBRAMYCIN) 100 MG capsule Take 1 capsule (100 mg total) by mouth 2 (two) times daily for 7 days. 02/15/21 02/22/21 Yes Sharion Balloon, NP  aspirin 81 MG EC tablet Take 1 tablet by mouth daily. 08/26/07   [provider]  lisinopril (ZESTRIL) 10 MG tablet Take 1 tablet (10 mg total) by mouth daily. 01/08/21   Birdie Sons, MD  Multiple Vitamins tablet Take 1 tablet by mouth daily. 07/29/09    [provider]  Multiple Vitamins-Minerals (ZINC PO) Take by mouth.    [provider]  Omega-3 Fatty Acids (FISH OIL) 1200 MG CAPS Take 1 capsule by mouth daily. 07/29/09   [provider]  Probiotic CAPS Take by mouth.    [provider]    Family History Family History  Problem Relation Age of Onset   Hypertension Mother    Hypertension Father    Cerebrovascular Accident Father    Thyroid cancer Cousin    Melanoma Son    Breast cancer Neg Hx     Social History Social History   Tobacco Use   Smoking status: Never   Smokeless tobacco: Never  Substance Use Topics   Alcohol use: Not Currently    Comment: Ocassionally once a month   Drug use: No     Allergies   Penicillins   Review of Systems Review of Systems  Constitutional:  Negative for chills and fever.  HENT:  Positive for ear pain and hearing loss. Negative for ear discharge and sore throat.   Respiratory:  Negative for cough and shortness of breath.   Cardiovascular:  Negative for chest pain and palpitations.  Skin:  Negative for color change and rash.  All other systems reviewed and are negative.   Physical Exam Triage Vital Signs ED Triage Vitals  Enc Vitals Group     BP      Pulse      Resp      Temp      Temp src      SpO2      Weight      Height      Head Circumference      Peak Flow      Pain Score      Pain Loc      Pain Edu?      Excl. in [place]?    No data found.  Updated Vital Signs BP (!) 148/74   Pulse 78   Temp 98.6 F (37 C)   Resp 16   LMP  (LMP Unknown)   SpO2 97%   Visual Acuity Right Eye Distance:   Left Eye Distance:   Bilateral Distance:    Right Eye Near:   Left Eye Near:    Bilateral Near:     Physical Exam Vitals and nursing note reviewed.  Constitutional:      General: She is not in acute distress.    Appearance: She is well-developed. She is not ill-appearing.  HENT:     Head: Normocephalic and atraumatic.     Right  Ear: Tympanic membrane and ear canal normal.     Left Ear: There is impacted cerumen. A foreign body is present. Tympanic membrane is erythematous.     Ears:     Comments: TM noted to be erythematous, after removal of cerumen and tip of cotton swab via irrigation.    Nose: Nose normal.     Mouth/Throat:     Mouth: Mucous membranes are moist.     Pharynx: Oropharynx is clear.  Eyes:     Conjunctiva/sclera: Conjunctivae normal.  Cardiovascular:     Rate and Rhythm: Normal rate and regular rhythm.     Heart sounds: Normal heart sounds.  Pulmonary:     Effort: Pulmonary effort is normal. No respiratory distress.     Breath sounds: Normal breath sounds.  Abdominal:     Palpations: Abdomen is soft.     Tenderness: There is no abdominal tenderness.  Musculoskeletal:     Cervical back: Neck supple.  Skin:    General: Skin is warm and dry.  Neurological:     General: No focal deficit present.     Mental Status: She is alert and oriented to person, place, and time.     Gait: Gait normal.  Psychiatric:        Mood and Affect: Mood normal.        Behavior: Behavior normal.     UC Treatments / Results  Labs (all labs ordered are listed, but only abnormal results are displayed) Labs Reviewed - No data to display  EKG   Radiology No results found.  Procedures Procedures (including critical care time)  Medications Ordered in UC Medications - No data to display  Initial Impression / Assessment and Plan / UC Course  I have reviewed the triage vital signs and the nursing notes.  Pertinent labs & imaging results that were available during my care of the patient were reviewed by me and considered in my medical decision making (see chart for  details).  Left otitis media, impacted cerumen, foreign body removal from ear canal.  Impacted cerumen and tip of cotton swab removed via irrigation by RN.  TM noted to be erythematous.  Treating with doxycycline (patient is allergic to  penicillin and states Zithromax has not worked for her in the past).  Discussed Tylenol or ibuprofen as needed for discomfort.  Instructed her to follow-up with her PCP if her symptoms are not improving.  Cautioned patient to stop using Q-tips in her ears.  She agrees to plan of care.   Final Clinical Impressions(s) / UC Diagnoses   Final diagnoses:  Left otitis media, unspecified otitis media type  Impacted cerumen of left ear  Foreign body of left ear, initial encounter     Discharge Instructions      Take the doxycycline as directed.  Follow up with your primary care provider if your symptoms are not improving.         ED Prescriptions     Medication Sig Dispense Auth. Provider   doxycycline (VIBRAMYCIN) 100 MG capsule Take 1 capsule (100 mg total) by mouth 2 (two) times daily for 7 days. 14 capsule Sharion Balloon, NP      PDMP not reviewed this encounter.   Sharion Balloon, NP 02/15/21 1336

## 2021-02-15 NOTE — Discharge Instructions (Addendum)
Take the doxycycline as directed.    Follow up with your primary care provider if your symptoms are not improving.    

## 2021-02-18 ENCOUNTER — Encounter: Payer: Self-pay | Admitting: Family Medicine

## 2021-02-18 DIAGNOSIS — H9201 Otalgia, right ear: Secondary | ICD-10-CM

## 2021-02-18 DIAGNOSIS — H66002 Acute suppurative otitis media without spontaneous rupture of ear drum, left ear: Secondary | ICD-10-CM

## 2021-02-19 MED ORDER — CEFDINIR 300 MG PO CAPS: 600.0000 mg | ORAL_CAPSULE | Freq: Every day | ORAL | 0 refills | Status: DC

## 2021-02-21 ENCOUNTER — Other Ambulatory Visit: Payer: Self-pay

## 2021-02-21 ENCOUNTER — Ambulatory Visit: Payer: BC Managed Care – PPO | Admitting: Family Medicine

## 2021-02-21 ENCOUNTER — Encounter: Payer: Self-pay | Admitting: Family Medicine

## 2021-02-21 VITALS — BP 132/70 | HR 76 | Temp 98.5°F | Wt 189.0 lb

## 2021-02-21 DIAGNOSIS — Z1211 Encounter for screening for malignant neoplasm of colon: Secondary | ICD-10-CM | POA: Diagnosis not present

## 2021-02-21 DIAGNOSIS — H66015 Acute suppurative otitis media with spontaneous rupture of ear drum, recurrent, left ear: Secondary | ICD-10-CM

## 2021-02-21 NOTE — Progress Notes (Signed)
      Established patient visit   Patient: Christy Johnson   DOB: [birth year]   65 y.o. Female  MRN: [id number] Visit Date: 02/21/2021  Today's healthcare provider: Lelon Huh, MD   Chief Complaint  Patient presents with   Ear Pain   Subjective    HPI  Follow up for left otitis media:  The patient was seen at an urgent care on 02/15/2021 for left otitis media, impacted cerumen and foreign body of the left ear. Ear irrigation was performed. Patient was prescribed Doxycycline. Patient called our office on 02/18/2021 reporting worsening ear pain. Antibiotic was changed to cefdinir.   Pain has improved since change to cefdinir. Started having green drainage in the mornings a few days ago.  She reports excellent compliance with treatment. She feels that condition is Improved. She is not having side effects.   -----------------------------------------------------------------------------------------     Medications: Outpatient Medications Prior to Visit  Medication Sig   aspirin 81 MG EC tablet Take 1 tablet by mouth daily.   cefdinir (OMNICEF) 300 MG capsule Take 2 capsules (600 mg total) by mouth daily for 7 days.   lisinopril (ZESTRIL) 10 MG tablet Take 1 tablet (10 mg total) by mouth daily.   Multiple Vitamins tablet Take 1 tablet by mouth daily.   Multiple Vitamins-Minerals (ZINC PO) Take by mouth.   Omega-3 Fatty Acids (FISH OIL) 1200 MG CAPS Take 1 capsule by mouth daily.   Probiotic CAPS Take by mouth.   doxycycline (VIBRAMYCIN) 100 MG capsule Take 1 capsule (100 mg total) by mouth 2 (two) times daily for 7 days.   No facility-administered medications prior to visit.    Review of Systems  Constitutional: Negative.   HENT:  Positive for ear discharge, ear pain, hearing loss and tinnitus. Negative for congestion, sinus pressure, sinus pain, sneezing and sore throat.   Respiratory: Negative.    Neurological:  Positive for light-headedness. Negative for dizziness and  headaches.      Objective    BP 132/70 (BP Location: Right Arm, Patient Position: Sitting, Cuff Size: Large)   Pulse 76   Temp 98.5 F (36.9 C) (Oral)   Wt 189 lb (85.7 kg)   LMP  (LMP Unknown)   SpO2 99%   BMI 34.57 kg/m    Physical Exam  TM partially obstructed by cerumen, small of amount of dried yellow drainage in ear canal. No erythema or ear canal. Visible portion of TM is dull, not inflamed.   No results found for any visits on 02/21/21.  Assessment & Plan      1. Recurrent acute suppurative otitis media with spontaneous rupture of left tympanic membrane Did not improvement with doxycycline, changed to cefdinir 3 days ago. Greatly improved the last 2 days. Suspect TM may have perforated relieving pressure in middle ear. She is to complete cefdnir and call if not completely resolved when finished.   2. Colon cancer screening Due for Ambulatory referral to Gastroenterology         The entirety of the information documented in the History of Present Illness, Review of Systems and Physical Exam were personally obtained by me. Portions of this information were initially documented by the CMA and reviewed by me for thoroughness and accuracy.     Lelon Huh, MD  [facility] [phone] (phone) [phone] (fax)  [place]

## 2021-02-27 MED ORDER — CEFDINIR 300 MG PO CAPS: 600.0000 mg | ORAL_CAPSULE | Freq: Every day | ORAL | 0 refills | Status: AC

## 2021-02-27 NOTE — Addendum Note (Signed)
Addended by: Birdie Sons on: 02/27/2021 04:48 PM   Modules accepted: Orders

## 2021-03-04 ENCOUNTER — Telehealth: Payer: Self-pay

## 2021-03-04 NOTE — Telephone Encounter (Signed)
Pt. Ready to schedule colonoscopy. She says she has a hearing problem so she has a hard time hearing the phone. She said best thing to do would be to call her right at 8 am.

## 2021-03-06 NOTE — Addendum Note (Signed)
Addended by: Birdie Sons on: 03/06/2021 08:24 AM   Modules accepted: Orders

## 2021-03-14 ENCOUNTER — Other Ambulatory Visit: Payer: Self-pay

## 2021-03-14 DIAGNOSIS — Z1211 Encounter for screening for malignant neoplasm of colon: Secondary | ICD-10-CM

## 2021-03-14 MED ORDER — NA SULFATE-K SULFATE-MG SULF 17.5-3.13-1.6 GM/177ML PO SOLN: 1.0000 | Freq: Once | ORAL | 0 refills | Status: AC

## 2021-03-14 NOTE — Progress Notes (Signed)
Gastroenterology Pre-Procedure Review  Request Date: 06/13/21 Requesting Physician: Dr. Vicente Males  PATIENT REVIEW QUESTIONS: The patient responded to the following health history questions as indicated:    1. Are you having any GI issues? no 2. Do you have a personal history of Polyps? no 3. Do you have a family history of Colon Cancer or Polyps? no 4. Diabetes Mellitus? no 5. Joint replacements in the past 12 months?no 6. Major health problems in the past 3 months?no 7. Any artificial heart valves, MVP, or defibrillator?no    MEDICATIONS & ALLERGIES:    Patient reports the following regarding taking any anticoagulation/antiplatelet therapy:   Plavix, Coumadin, Eliquis, Xarelto, Lovenox, Pradaxa, Brilinta, or Effient? no Aspirin? yes (81 mg)  Patient confirms/reports the following medications:  Current Outpatient Medications  Medication Sig Dispense Refill   aspirin 81 MG EC tablet Take 1 tablet by mouth daily.     lisinopril (ZESTRIL) 10 MG tablet Take 1 tablet (10 mg total) by mouth daily. 90 tablet 0   Multiple Vitamins tablet Take 1 tablet by mouth daily.     Multiple Vitamins-Minerals (ZINC PO) Take by mouth.     Omega-3 Fatty Acids (FISH OIL) 1200 MG CAPS Take 1 capsule by mouth daily.     Probiotic CAPS Take by mouth.     No current facility-administered medications for this visit.    Patient confirms/reports the following allergies:  Allergies  Allergen Reactions   Penicillins Rash    No orders of the defined types were placed in this encounter.   AUTHORIZATION INFORMATION Primary Insurance: 1D#: Group #:  Secondary Insurance: 1D#: Group #:  SCHEDULE INFORMATION: Date: 06/13/21 Time: Location: [facility]

## 2021-03-31 DIAGNOSIS — H60392 Other infective otitis externa, left ear: Secondary | ICD-10-CM | POA: Diagnosis not present

## 2021-03-31 DIAGNOSIS — H60332 Swimmer's ear, left ear: Secondary | ICD-10-CM | POA: Diagnosis not present

## 2021-04-09 ENCOUNTER — Encounter: Payer: BC Managed Care – PPO | Admitting: Family Medicine

## 2021-04-09 DIAGNOSIS — H60332 Swimmer's ear, left ear: Secondary | ICD-10-CM | POA: Diagnosis not present

## 2021-04-09 DIAGNOSIS — H6062 Unspecified chronic otitis externa, left ear: Secondary | ICD-10-CM | POA: Diagnosis not present

## 2021-04-09 DIAGNOSIS — H60392 Other infective otitis externa, left ear: Secondary | ICD-10-CM | POA: Diagnosis not present

## 2021-04-24 ENCOUNTER — Other Ambulatory Visit: Payer: Self-pay | Admitting: Family Medicine

## 2021-04-24 DIAGNOSIS — I10 Essential (primary) hypertension: Secondary | ICD-10-CM

## 2021-06-12 NOTE — Anesthesia Preprocedure Evaluation (Addendum)
Anesthesia Evaluation  Patient identified by MRN, date of birth, ID band Patient awake    Reviewed: Allergy & Precautions, Patient's Chart, lab work & pertinent test results  Airway Mallampati: II  TM Distance: >3 FB Neck ROM: Full    Dental no notable dental hx.    Pulmonary    Pulmonary exam normal breath sounds clear to auscultation       Cardiovascular hypertension, Normal cardiovascular exam Rhythm:Regular Rate:Normal  HAS A L COMMON ILIAC  STENT - NO CX   Neuro/Psych    GI/Hepatic   Endo/Other  Hyperthyroidism Morbid obesity  Renal/GU      Musculoskeletal   Abdominal   Peds  Hematology   Anesthesia Other Findings   Reproductive/Obstetrics                           Anesthesia Physical Anesthesia Plan  ASA: 3  Anesthesia Plan: General   Post-op Pain Management:    Induction: Intravenous  PONV Risk Score and Plan:   Airway Management Planned: Natural Airway and Nasal Cannula  Additional Equipment:   Intra-op Plan:   Post-operative Plan:   Informed Consent: I have reviewed the patients History and Physical, chart, labs and discussed the procedure including the risks, benefits and alternatives for the proposed anesthesia with the patient or authorized representative who has indicated his/her understanding and acceptance.     Dental Advisory Given  Plan Discussed with: Anesthesiologist, CRNA and Surgeon  Anesthesia Plan Comments: (Patient consented for risks of anesthesia including but not limited to:  - adverse reactions to medications - risk of airway placement if required - damage to eyes, teeth, lips or other oral mucosa - nerve damage due to positioning  - sore throat or hoarseness - Damage to heart, brain, nerves, lungs, other parts of body or loss of life  Patient voiced understanding.)        Anesthesia Quick Evaluation

## 2021-06-13 ENCOUNTER — Encounter: Payer: Self-pay | Admitting: Gastroenterology

## 2021-06-13 ENCOUNTER — Ambulatory Visit
Admission: RE | Admit: 2021-06-13 | Discharge: 2021-06-13 | Disposition: A | Discharge status: Home / Self Care | Payer: BC Managed Care – PPO | Attending: Gastroenterology | Admitting: Gastroenterology

## 2021-06-13 ENCOUNTER — Encounter
Admission: RE | Disposition: A | Discharge status: Home / Self Care | Payer: Self-pay | Source: Home / Self Care | Attending: Gastroenterology

## 2021-06-13 ENCOUNTER — Ambulatory Visit: Payer: BC Managed Care – PPO | Admitting: Anesthesiology

## 2021-06-13 DIAGNOSIS — Z6836 Body mass index (BMI) 36.0-36.9, adult: Secondary | ICD-10-CM | POA: Insufficient documentation

## 2021-06-13 DIAGNOSIS — K5730 Diverticulosis of large intestine without perforation or abscess without bleeding: Secondary | ICD-10-CM | POA: Insufficient documentation

## 2021-06-13 DIAGNOSIS — K621 Rectal polyp: Secondary | ICD-10-CM | POA: Diagnosis not present

## 2021-06-13 DIAGNOSIS — D123 Benign neoplasm of transverse colon: Secondary | ICD-10-CM | POA: Insufficient documentation

## 2021-06-13 DIAGNOSIS — Z1211 Encounter for screening for malignant neoplasm of colon: Secondary | ICD-10-CM | POA: Diagnosis not present

## 2021-06-13 DIAGNOSIS — D128 Benign neoplasm of rectum: Secondary | ICD-10-CM | POA: Diagnosis not present

## 2021-06-13 DIAGNOSIS — E0590 Thyrotoxicosis, unspecified without thyrotoxic crisis or storm: Secondary | ICD-10-CM | POA: Diagnosis not present

## 2021-06-13 DIAGNOSIS — D122 Benign neoplasm of ascending colon: Secondary | ICD-10-CM | POA: Insufficient documentation

## 2021-06-13 DIAGNOSIS — D126 Benign neoplasm of colon, unspecified: Secondary | ICD-10-CM | POA: Diagnosis not present

## 2021-06-13 DIAGNOSIS — K635 Polyp of colon: Secondary | ICD-10-CM | POA: Diagnosis not present

## 2021-06-13 HISTORY — PX: COLONOSCOPY WITH PROPOFOL: SHX5780

## 2021-06-13 SURGERY — COLONOSCOPY WITH PROPOFOL
Anesthesia: General

## 2021-06-13 MED ORDER — LIDOCAINE HCL (CARDIAC) PF 100 MG/5ML IV SOSY
PREFILLED_SYRINGE | INTRAVENOUS | Status: DC | PRN
  Administered 2021-06-13: 90 mg via INTRAVENOUS

## 2021-06-13 MED ORDER — SODIUM CHLORIDE 0.9 % IV SOLN: INTRAVENOUS | Status: DC

## 2021-06-13 MED ORDER — STERILE WATER FOR IRRIGATION IR SOLN
Status: DC | PRN
  Administered 2021-06-13: 60 mL

## 2021-06-13 MED ORDER — PROPOFOL 10 MG/ML IV BOLUS
INTRAVENOUS | Status: DC | PRN
  Administered 2021-06-13: 30 mg via INTRAVENOUS
  Administered 2021-06-13: 20 mg via INTRAVENOUS
  Administered 2021-06-13: 50 mg via INTRAVENOUS

## 2021-06-13 MED ORDER — PROPOFOL 500 MG/50ML IV EMUL
INTRAVENOUS | Status: DC | PRN
  Administered 2021-06-13: 140 ug/kg/min via INTRAVENOUS

## 2021-06-13 NOTE — Anesthesia Procedure Notes (Signed)
Date/Time: 06/13/2021 8:15 AM Performed by: Lily Peer, Mette Southgate, CRNA Pre-anesthesia Checklist: Patient identified, Emergency Drugs available, Timeout performed, Patient being monitored and Suction available Patient Re-evaluated:Patient Re-evaluated prior to induction Oxygen Delivery Method: Nasal cannula Induction Type: IV induction

## 2021-06-13 NOTE — H&P (Signed)
Jonathon Bellows, MD [address] [place], [place], [place], [address] [address], [place], [place], [place], [address] Phone: [phone]  Fax: [phone]  Primary Care Physician:  Birdie Sons, MD   Pre-Procedure History & Physical: HPI:  Christy Johnson is a 66 y.o. female is here for an colonoscopy.   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 Authorizing Provider  lisinopril (ZESTRIL) 10 MG tablet Take 1 tablet by mouth once daily 04/24/21  Yes Fisher, Kirstie Peri, MD  aspirin 81 MG EC tablet Take 1 tablet by mouth daily. 08/26/07   [provider]  Multiple Vitamins tablet Take 1 tablet by mouth daily. 07/29/09   [provider]  Multiple Vitamins-Minerals (ZINC PO) Take by mouth.    [provider]  Omega-3 Fatty Acids (FISH OIL) 1200 MG CAPS Take 1 capsule by mouth daily. 07/29/09   [provider]  Probiotic CAPS Take by mouth.    [provider]    Allergies as of 03/14/2021 - Review Complete 02/21/2021  Allergen Reaction Noted   Penicillins Rash 02/28/2015    Family History  Problem Relation Age of Onset   Hypertension Mother    Hypertension Father    Cerebrovascular Accident Father    Colon polyps Paternal Grandmother    Melanoma Son    Thyroid cancer Cousin    Breast cancer Neg Hx     Social History   Socioeconomic History   Marital status: Married    Spouse name: Not on file   Number of children: 3   Years of education: Not on file   Highest education level: Not on file  Occupational History   Occupation: clinical team coordinator    Employer: HOS[employer]  Tobacco Use    Smoking status: Never   Smokeless tobacco: Never  Vaping Use   Vaping Use: Not on file  Substance and Sexual Activity   Alcohol use: Not Currently    Comment: Ocassionally once a month   Drug use: No   Sexual activity: Not on file  Other Topics Concern   Not on file  Social History Narrative   Not on file   Social Determinants of Health   Financial Resource Strain: Not on file  Food Insecurity: Not on file  Transportation Needs: Not on file  Physical Activity: Not on file  Stress: Not on file  Social Connections: Not on file  Intimate Partner Violence: Not on file    Review of Systems: See HPI, otherwise negative ROS  Physical Exam: BP (!) 158/70    Pulse 86    Temp (!) 96.8 F (36 C) (Temporal)    Resp 18    Ht 5\' 2"  (1.575 m)    Wt 90.3 kg    LMP  (LMP Unknown)    SpO2 100%    BMI 36.40 kg/m  General:   Alert,  pleasant and cooperative in NAD Head:  Normocephalic and atraumatic. Neck:  Supple; no masses or thyromegaly. Lungs:  Clear throughout to auscultation, normal respiratory effort.    Heart:  +S1, +S2, Regular  rate and rhythm, No edema. Abdomen:  Soft, nontender and nondistended. Normal bowel sounds, without guarding, and without rebound.   Neurologic:  Alert and  oriented x4;  grossly normal neurologically.  Impression/Plan: Christy Johnson is here for an colonoscopy to be performed for Screening colonoscopy average risk   Risks, benefits, limitations, and alternatives regarding  colonoscopy have been reviewed with the patient.  Questions have been answered.  All parties agreeable.   Jonathon Bellows, MD  06/13/2021, 8:07 AM

## 2021-06-13 NOTE — Transfer of Care (Signed)
Immediate Anesthesia Transfer of Care Note  Patient: Christy Johnson  Procedure(s) Performed: COLONOSCOPY WITH PROPOFOL  Patient Location: Endoscopy Unit  Anesthesia Type:General  Level of Consciousness: awake and patient cooperative  Airway & Oxygen Therapy: Patient Spontanous Breathing  Post-op Assessment: Report given to RN and Post -op Vital signs reviewed and stable  Post vital signs: Reviewed and stable  Last Vitals:  Vitals Value Taken Time  BP 107/63 06/13/21 0857  Temp    Pulse 68 06/13/21 0857  Resp 16 06/13/21 0857  SpO2 99 % 06/13/21 0857  Vitals shown include unvalidated device data.  Last Pain:  Vitals:   06/13/21 0747  TempSrc: Temporal  PainSc: 0-No pain         Complications: No notable events documented.

## 2021-06-13 NOTE — Anesthesia Postprocedure Evaluation (Signed)
Anesthesia Post Note  Patient: Christy Johnson  Procedure(s) Performed: COLONOSCOPY WITH PROPOFOL  Anesthesia Type: General Anesthetic complications: no   There were no known notable events for this encounter.   Last Vitals:  Vitals:   06/13/21 0858 06/13/21 0900  BP: 107/63 108/65  Pulse: 86 71  Resp: 16 19  Temp: (!) 36 C   SpO2: 99% 99%    Last Pain:  Vitals:   06/13/21 0858  TempSrc: Temporal  PainSc:                  [place] [facility]

## 2021-06-13 NOTE — Op Note (Signed)
[facility] Patient Name: Elexa Kivi Procedure Date: 06/13/2021 8:10 AM MRN: [id number] Account #: [id number] Date of Birth: [birth year] Admit Type: Outpatient Age: 66 Room: [facility] ENDO ROOM 4 Gender: Female Note Status: Finalized Instrument Name: Jasper Riling [id number] Procedure:             Colonoscopy Indications:           Screening for colorectal malignant neoplasm Providers:             Jonathon Bellows MD, MD Referring MD:          Kirstie Peri. Caryn Section, MD (Referring MD) Medicines:             Monitored Anesthesia Care Complications:         No immediate complications. Procedure:             Pre-Anesthesia Assessment:                        - Prior to the procedure, a History and Physical was                         performed, and patient medications, allergies and                         sensitivities were reviewed. The patient's tolerance                         of previous anesthesia was reviewed.                        - The risks and benefits of the procedure and the                         sedation options and risks were discussed with the                         patient. All questions were answered and informed                         consent was obtained.                        - ASA Grade Assessment: II - A patient with mild                         systemic disease.                        After obtaining informed consent, the colonoscope was                         passed under direct vision. Throughout the procedure,                         the patient's blood pressure, pulse, and oxygen                         saturations were monitored continuously. The                         Colonoscope was  introduced through the anus and                         advanced to the the cecum, identified by the                         appendiceal orifice. The colonoscopy was performed                         with ease. The patient tolerated the procedure well.                          The quality of the bowel preparation was good. Findings:      The perianal and digital rectal examinations were normal.      Two sessile polyps were found in the rectum. The polyps were 5 to 7 mm       in size. These polyps were removed with a cold snare. Resection and       retrieval were complete.      Multiple small-mouthed diverticula were found in the left colon.      Three sessile polyps were found in the ascending colon. The polyps were       8 to 12 mm in size. These polyps were removed with a piecemeal technique       using a cold snare. Resection and retrieval were complete. To prevent       bleeding after the polypectomy, five hemostatic clips were successfully       placed. There was no bleeding during, or at the end, of the procedure.      A 7 mm polyp was found in the transverse colon. The polyp was sessile.       The polyp was removed with a cold snare. Resection and retrieval were       complete.      The exam was otherwise without abnormality on direct and retroflexion       views. Impression:            - Two 5 to 7 mm polyps in the rectum, removed with a                         cold snare. Resected and retrieved.                        - Diverticulosis in the left colon.                        - Three 8 to 12 mm polyps in the ascending colon,                         removed piecemeal using a cold snare. Resected and                         retrieved. Clips were placed.                        - One 7 mm polyp in the transverse colon, removed with                         a cold snare. Resected  and retrieved.                        - The examination was otherwise normal on direct and                         retroflexion views. Recommendation:        - Discharge patient to home (with escort).                        - Resume previous diet.                        - Continue present medications.                        - Await pathology results.                         - Repeat colonoscopy in 6 months for surveillance                         after piecemeal polypectomy. Procedure Code(s):     --- Professional ---                        [phone], Colonoscopy, flexible; with removal of                         tumor(s), polyp(s), or other lesion(s) by snare                         technique Diagnosis Code(s):     --- Professional ---                        Z12.11, Encounter for screening for malignant neoplasm                         of colon                        K62.1, Rectal polyp                        K63.5, Polyp of colon                        K57.30, Diverticulosis of large intestine without                         perforation or abscess without bleeding CPT copyright 2019 American Medical Association. All rights reserved. The codes documented in this report are preliminary and upon coder review may  be revised to meet current compliance requirements. Jonathon Bellows, MD Jonathon Bellows MD, MD 06/13/2021 8:56:08 AM This report has been signed electronically. Number of Addenda: 0 Note Initiated On: 06/13/2021 8:10 AM Scope Withdrawal Time: 0 hours 29 minutes 15 seconds  Total Procedure Duration: 0 hours 33 minutes 54 seconds  Estimated Blood Loss:  Estimated blood loss: none.      [facility]

## 2021-06-16 ENCOUNTER — Encounter: Payer: Self-pay | Admitting: Family Medicine

## 2021-06-16 ENCOUNTER — Encounter: Payer: Self-pay | Admitting: Gastroenterology

## 2021-06-16 DIAGNOSIS — Z860100 Personal history of colon polyps, unspecified: Secondary | ICD-10-CM | POA: Insufficient documentation

## 2021-06-16 LAB — SURGICAL PATHOLOGY

## 2021-06-17 ENCOUNTER — Encounter: Payer: Self-pay | Admitting: Gastroenterology

## 2021-07-10 ENCOUNTER — Other Ambulatory Visit: Payer: Self-pay

## 2021-07-10 ENCOUNTER — Telehealth: Payer: Self-pay

## 2021-07-10 DIAGNOSIS — Z1211 Encounter for screening for malignant neoplasm of colon: Secondary | ICD-10-CM

## 2021-07-10 MED ORDER — PEG 3350-KCL-NA BICARB-NACL 420 G PO SOLR: 4000.0000 mL | Freq: Once | ORAL | 0 refills | Status: AC

## 2021-07-10 MED ORDER — SUTAB 1479-225-188 MG PO TABS: 12.0000 | ORAL_TABLET | Freq: Once | ORAL | 0 refills | Status: AC

## 2021-07-10 NOTE — Progress Notes (Signed)
Scheduled for June 16-2023

## 2021-07-10 NOTE — Telephone Encounter (Signed)
Pt calling to schedule her 6 month repeat colonoscopy per Dr Vicente Males.

## 2021-07-10 NOTE — Telephone Encounter (Signed)
Hope contacted the patient and scheduled her colonoscopy for 11/21/2021.

## 2021-08-25 DIAGNOSIS — J301 Allergic rhinitis due to pollen: Secondary | ICD-10-CM | POA: Diagnosis not present

## 2021-08-25 DIAGNOSIS — H60392 Other infective otitis externa, left ear: Secondary | ICD-10-CM | POA: Diagnosis not present

## 2021-09-29 DIAGNOSIS — M79661 Pain in right lower leg: Secondary | ICD-10-CM | POA: Diagnosis not present

## 2021-09-29 DIAGNOSIS — S86111A Strain of other muscle(s) and tendon(s) of posterior muscle group at lower leg level, right leg, initial encounter: Secondary | ICD-10-CM | POA: Diagnosis not present

## 2021-09-30 ENCOUNTER — Ambulatory Visit
Admission: RE | Admit: 2021-09-30 | Discharge: 2021-09-30 | Disposition: A | Discharge status: Home / Self Care | Payer: BC Managed Care – PPO | Source: Ambulatory Visit | Attending: Physician Assistant | Admitting: Physician Assistant

## 2021-09-30 ENCOUNTER — Other Ambulatory Visit: Payer: Self-pay | Admitting: Physician Assistant

## 2021-09-30 DIAGNOSIS — M79661 Pain in right lower leg: Secondary | ICD-10-CM

## 2021-11-19 ENCOUNTER — Other Ambulatory Visit: Payer: Self-pay

## 2021-11-19 DIAGNOSIS — Z1211 Encounter for screening for malignant neoplasm of colon: Secondary | ICD-10-CM

## 2021-11-20 ENCOUNTER — Telehealth: Payer: Self-pay

## 2021-11-20 NOTE — Telephone Encounter (Signed)
CALLED PATIENT NO ANSWER LEFT VOICEMAIL FOR A CALL BACK ? ?

## 2021-11-21 ENCOUNTER — Encounter: Admission: RE | Payer: Self-pay | Source: Home / Self Care

## 2021-11-21 ENCOUNTER — Ambulatory Visit
Admission: RE | Admit: 2021-11-21 | Payer: BC Managed Care – PPO | Source: Home / Self Care | Admitting: Gastroenterology

## 2021-11-21 SURGERY — COLONOSCOPY WITH PROPOFOL
Anesthesia: General

## 2021-11-24 ENCOUNTER — Telehealth: Payer: Self-pay

## 2021-11-24 NOTE — Telephone Encounter (Signed)
CALLED PATIENT NO ANSWER LEFT VOICEMAIL FOR A CALL BACK ? ?

## 2021-12-25 DIAGNOSIS — H6063 Unspecified chronic otitis externa, bilateral: Secondary | ICD-10-CM | POA: Diagnosis not present

## 2022-01-12 DIAGNOSIS — Z86006 Personal history of melanoma in-situ: Secondary | ICD-10-CM | POA: Diagnosis not present

## 2022-01-12 DIAGNOSIS — Z85820 Personal history of malignant melanoma of skin: Secondary | ICD-10-CM | POA: Diagnosis not present

## 2022-01-12 DIAGNOSIS — D2261 Melanocytic nevi of right upper limb, including shoulder: Secondary | ICD-10-CM | POA: Diagnosis not present

## 2022-01-12 DIAGNOSIS — D225 Melanocytic nevi of trunk: Secondary | ICD-10-CM | POA: Diagnosis not present

## 2022-01-23 ENCOUNTER — Encounter: Payer: Self-pay | Admitting: Gastroenterology

## 2022-01-26 ENCOUNTER — Encounter: Payer: Self-pay | Admitting: Gastroenterology

## 2022-01-26 ENCOUNTER — Encounter
Admission: RE | Disposition: A | Discharge status: Home / Self Care | Payer: Self-pay | Source: Home / Self Care | Attending: Gastroenterology

## 2022-01-26 ENCOUNTER — Ambulatory Visit
Admission: RE | Admit: 2022-01-26 | Discharge: 2022-01-26 | Disposition: A | Discharge status: Home / Self Care | Payer: BC Managed Care – PPO | Attending: Gastroenterology | Admitting: Gastroenterology

## 2022-01-26 ENCOUNTER — Ambulatory Visit: Payer: BC Managed Care – PPO | Admitting: Registered Nurse

## 2022-01-26 DIAGNOSIS — Z08 Encounter for follow-up examination after completed treatment for malignant neoplasm: Secondary | ICD-10-CM | POA: Diagnosis not present

## 2022-01-26 DIAGNOSIS — K5730 Diverticulosis of large intestine without perforation or abscess without bleeding: Secondary | ICD-10-CM | POA: Diagnosis not present

## 2022-01-26 DIAGNOSIS — Z87891 Personal history of nicotine dependence: Secondary | ICD-10-CM | POA: Insufficient documentation

## 2022-01-26 DIAGNOSIS — I10 Essential (primary) hypertension: Secondary | ICD-10-CM | POA: Diagnosis not present

## 2022-01-26 DIAGNOSIS — Z1211 Encounter for screening for malignant neoplasm of colon: Secondary | ICD-10-CM

## 2022-01-26 DIAGNOSIS — Z86010 Personal history of colonic polyps: Secondary | ICD-10-CM | POA: Diagnosis not present

## 2022-01-26 HISTORY — PX: COLONOSCOPY WITH PROPOFOL: SHX5780

## 2022-01-26 SURGERY — COLONOSCOPY WITH PROPOFOL
Anesthesia: General

## 2022-01-26 MED ORDER — PROPOFOL 10 MG/ML IV BOLUS
INTRAVENOUS | Status: DC | PRN
  Administered 2022-01-26: 80 mg via INTRAVENOUS
  Administered 2022-01-26: 20 mg via INTRAVENOUS

## 2022-01-26 MED ORDER — PROPOFOL 500 MG/50ML IV EMUL
INTRAVENOUS | Status: DC | PRN
  Administered 2022-01-26: 175 ug/kg/min via INTRAVENOUS

## 2022-01-26 MED ORDER — EPHEDRINE SULFATE (PRESSORS) 50 MG/ML IJ SOLN
INTRAMUSCULAR | Status: DC | PRN
  Administered 2022-01-26: 10 mg via INTRAVENOUS

## 2022-01-26 MED ORDER — SODIUM CHLORIDE 0.9 % IV SOLN: INTRAVENOUS | Status: DC

## 2022-01-26 MED ORDER — LIDOCAINE HCL (CARDIAC) PF 100 MG/5ML IV SOSY
PREFILLED_SYRINGE | INTRAVENOUS | Status: DC | PRN
  Administered 2022-01-26: 100 mg via INTRAVENOUS

## 2022-01-26 NOTE — Transfer of Care (Signed)
Immediate Anesthesia Transfer of Care Note  Patient: Christy Johnson  Procedure(s) Performed: COLONOSCOPY WITH PROPOFOL  Patient Location: Endoscopy Unit  Anesthesia Type:General  Level of Consciousness: drowsy  Airway & Oxygen Therapy: Patient Spontanous Breathing  Post-op Assessment: Report given to RN  Post vital signs: Reviewed and stable  Last Vitals:  Vitals Value Taken Time  BP 102/47 01/26/22 0859  Temp 35.6 C 01/26/22 0859  Pulse 81 01/26/22 0900  Resp 15 01/26/22 0900  SpO2 95 % 01/26/22 0900  Vitals shown include unvalidated device data.  Last Pain:  Vitals:   01/26/22 0859  TempSrc: Tympanic  PainSc: Asleep         Complications: No notable events documented.

## 2022-01-26 NOTE — Anesthesia Postprocedure Evaluation (Signed)
Anesthesia Post Note  Patient: Christy Johnson  Procedure(s) Performed: COLONOSCOPY WITH PROPOFOL  Patient location during evaluation: Endoscopy Anesthesia Type: General Level of consciousness: awake and alert Pain management: pain level controlled Vital Signs Assessment: post-procedure vital signs reviewed and stable Respiratory status: spontaneous breathing, nonlabored ventilation, respiratory function stable and patient connected to nasal cannula oxygen Cardiovascular status: blood pressure returned to baseline and stable Postop Assessment: no apparent nausea or vomiting Anesthetic complications: no   No notable events documented.   Last Vitals:  Vitals:   01/26/22 0909 01/26/22 0919  BP: 111/61 109/62  Pulse: 71 76  Resp: 14 18  Temp:    SpO2: 100% 99%    Last Pain:  Vitals:   01/26/22 0919  TempSrc:   PainSc: 0-No pain                 Arita Miss

## 2022-01-26 NOTE — Anesthesia Preprocedure Evaluation (Signed)
Anesthesia Evaluation  Patient identified by MRN, date of birth, ID band Patient awake    Reviewed: Allergy & Precautions, NPO status , Patient's Chart, lab work & pertinent test results  History of Anesthesia Complications Negative for: history of anesthetic complications  Airway Mallampati: II  TM Distance: >3 FB Neck ROM: Full    Dental no notable dental hx. (+) Teeth Intact   Pulmonary neg pulmonary ROS, neg sleep apnea, neg COPD, Patient abstained from smoking.Not current smoker,    Pulmonary exam normal breath sounds clear to auscultation       Cardiovascular Exercise Tolerance: Good METShypertension, Pt. 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  ([place])     Comment:  2008 at [place] stent placed  No date: Hypertension  Reproductive/Obstetrics                             Anesthesia Physical Anesthesia Plan  ASA: 2  Anesthesia Plan: General   Post-op Pain Management: Minimal or no pain anticipated   Induction: Intravenous  PONV Risk Score and Plan: 3 and Propofol infusion, TIVA and Ondansetron  Airway Management Planned: Nasal Cannula  Additional Equipment: None  Intra-op Plan:   Post-operative Plan:   Informed Consent: I have reviewed the patients History and Physical, chart, labs and discussed the procedure including the risks, benefits and alternatives for the proposed anesthesia with the patient or authorized representative who has indicated his/her understanding  and acceptance.     Dental advisory given  Plan Discussed with: CRNA and Surgeon  Anesthesia Plan Comments: (Discussed risks of anesthesia with patient, including possibility of difficulty with spontaneous ventilation under anesthesia necessitating airway intervention, PONV, and rare risks such as cardiac or respiratory or neurological events, and allergic reactions. Discussed the role of CRNA in patient's perioperative care. Patient understands.)        Anesthesia Quick Evaluation

## 2022-01-26 NOTE — H&P (Signed)
Jonathon Bellows, MD [address], [place], [place], [place], [address] [address], [place], [place], [place], [address] Phone: [phone]  Fax: [phone]  Primary Care Physician:  Birdie Sons, MD   Pre-Procedure History & Physical: HPI:  Christy Johnson is a 66 y.o. female is here for an colonoscopy.   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 Authorizing Provider  lisinopril (ZESTRIL) 10 MG tablet Take 1 tablet by mouth once daily 04/24/21  Yes Birdie Sons, MD  Multiple Vitamins tablet Take 1 tablet by mouth daily. 07/29/09  Yes [provider]  Multiple Vitamins-Minerals (ZINC PO) Take by mouth.   Yes [provider]  Omega-3 Fatty Acids (FISH OIL) 1200 MG CAPS Take 1 capsule by mouth daily. 07/29/09  Yes [provider]  Probiotic CAPS Take by mouth.   Yes [provider]  aspirin 81 MG EC tablet Take 1 tablet by mouth daily. Patient not taking: Reported on 01/26/2022 08/26/07   [provider]    Allergies as of 11/19/2021 - Review Complete 11/19/2021  Allergen Reaction Noted   Penicillins Rash 02/28/2015    Family History  Problem Relation Age of Onset   Hypertension Mother    Hypertension Father    Cerebrovascular Accident Father    Colon polyps Paternal Grandmother    Melanoma Son    Thyroid cancer Cousin    Breast cancer Neg Hx     Social History   Socioeconomic History   Marital status:  Married    Spouse name: Not on file   Number of children: 3   Years of education: Not on file   Highest education level: Not on file  Occupational History   Occupation: clinical team coordinator    Employer: HOS[employer]  Tobacco Use   Smoking status: Never   Smokeless tobacco: Never  Vaping Use   Vaping Use: Never used  Substance and Sexual Activity   Alcohol use: Not Currently    Comment: Ocassionally once a month   Drug use: No   Sexual activity: Not on file  Other Topics Concern   Not on file  Social History Narrative   Not on file   Social Determinants of Health   Financial Resource Strain: Not on file  Food Insecurity: Not on file  Transportation Needs: Not on file  Physical Activity: Not on file  Stress: Not on file  Social Connections: Not on file  Intimate Partner Violence: Not on file    Review of Systems: See HPI, otherwise negative ROS  Physical Exam: BP (!) 146/72   Pulse 90   Temp (!) 96.3 F (35.7 C) (Temporal)   Resp 16   Ht '5\' 3"'$  (1.6 m)   Wt 86.2 kg   LMP  (LMP Unknown)   SpO2 100%   BMI 33.66 kg/m  General:   Alert,  pleasant and cooperative in NAD Head:  Normocephalic and atraumatic. Neck:  Supple; no masses or thyromegaly. Lungs:  Clear throughout to auscultation, normal respiratory effort.    Heart:  +S1, +S2, Regular rate and rhythm, No edema. Abdomen:  Soft, nontender and nondistended. Normal bowel sounds, without guarding, and without rebound.   Neurologic:  Alert and  oriented x4;  grossly normal neurologically.  Impression/Plan: Christy Johnson is here for an colonoscopy to be performed for surveillance due to prior history of colon polyps   Risks, benefits, limitations, and alternatives regarding  colonoscopy have been reviewed with the patient.  Questions have been answered.  All parties agreeable.   Jonathon Bellows, MD  01/26/2022, 8:32 AM

## 2022-01-26 NOTE — Op Note (Signed)
[facility] Gastroenterology Patient Name: Christy Johnson Procedure Date: 01/26/2022 8:34 AM MRN: [id number] Account #: [id number] Date of Birth: 1955-10-30 Admit Type: Outpatient Age: 66 Room: [facility] ENDO ROOM 1 Gender: Female Note Status: Finalized Instrument Name: Park Meo [id number] Procedure:             Colonoscopy Indications:           Surveillance: Piecemeal removal of large sessile                         adenoma last colonoscopy (< 3 yrs) Providers:             Jonathon Bellows MD, MD Referring MD:          Kirstie Peri. Caryn Section, MD (Referring MD) Medicines:             Monitored Anesthesia Care Complications:         No immediate complications. Procedure:             Pre-Anesthesia Assessment:                        - Prior to the procedure, a History and Physical was                         performed, and patient medications, allergies and                         sensitivities were reviewed. The patient's tolerance                         of previous anesthesia was reviewed.                        - The risks and benefits of the procedure and the                         sedation options and risks were discussed with the                         patient. All questions were answered and informed                         consent was obtained.                        - ASA Grade Assessment: II - A patient with mild                         systemic disease.                        After obtaining informed consent, the colonoscope was                         passed under direct vision. Throughout the procedure,                         the patient's blood pressure, pulse, and oxygen  saturations were monitored continuously. The                         Colonoscope was introduced through the anus and                         advanced to the the cecum, identified by the                         appendiceal orifice. The colonoscopy was performed                          with ease. The patient tolerated the procedure well.                         The quality of the bowel preparation was excellent. Findings:      The perianal and digital rectal examinations were normal.      Multiple small and large-mouthed diverticula were found in the left       colon.      The exam was otherwise without abnormality on direct and retroflexion       views. Impression:            - Diverticulosis in the left colon.                        - The examination was otherwise normal on direct and                         retroflexion views.                        - No specimens collected. Recommendation:        - Discharge patient to home (with escort).                        - Resume previous diet.                        - Continue present medications.                        - Repeat colonoscopy in 1 year for surveillance. Procedure Code(s):     --- Professional ---                        [phone], Colonoscopy, flexible; diagnostic, including                         collection of specimen(s) by brushing or washing, when                         performed (separate procedure) Diagnosis Code(s):     --- Professional ---                        Z86.010, Personal history of colonic polyps                        K57.30, Diverticulosis of large intestine without  perforation or abscess without bleeding CPT copyright 2019 American Medical Association. All rights reserved. The codes documented in this report are preliminary and upon coder review may  be revised to meet current compliance requirements. Jonathon Bellows, MD Jonathon Bellows MD, MD 01/26/2022 8:56:34 AM This report has been signed electronically. Number of Addenda: 0 Note Initiated On: 01/26/2022 8:34 AM Scope Withdrawal Time: 0 hours 7 minutes 5 seconds  Total Procedure Duration: 0 hours 12 minutes 5 seconds  Estimated Blood Loss:  Estimated blood loss: none.      [facility]

## 2022-01-27 ENCOUNTER — Encounter: Payer: Self-pay | Admitting: Gastroenterology

## 2022-02-05 ENCOUNTER — Ambulatory Visit: Payer: Self-pay

## 2022-02-05 NOTE — Patient Outreach (Signed)
  Care Coordination   02/05/2022 Name: AURIANA SCALIA MRN: [id number] DOB: 1956/04/23   Care Coordination Outreach Attempts:  An unsuccessful telephone outreach was attempted today to offer the patient information about available care coordination services as a benefit of their health plan.   Follow Up Plan:  Additional outreach attempts will be made to offer the patient care coordination information and services.   Encounter Outcome:  No Answer  Care Coordination Interventions Activated:  No   Care Coordination Interventions:  No, not indicated   Noreene Larsson RN, MSN, CCM Community Care Coordinator [facility] Mobile: [phone]

## 2022-07-27 DIAGNOSIS — Z86006 Personal history of melanoma in-situ: Secondary | ICD-10-CM | POA: Diagnosis not present

## 2022-07-27 DIAGNOSIS — D485 Neoplasm of uncertain behavior of skin: Secondary | ICD-10-CM | POA: Diagnosis not present

## 2022-07-27 DIAGNOSIS — Z85820 Personal history of malignant melanoma of skin: Secondary | ICD-10-CM | POA: Diagnosis not present

## 2022-07-27 DIAGNOSIS — D225 Melanocytic nevi of trunk: Secondary | ICD-10-CM | POA: Diagnosis not present

## 2022-07-27 DIAGNOSIS — L814 Other melanin hyperpigmentation: Secondary | ICD-10-CM | POA: Diagnosis not present

## 2022-08-06 NOTE — Progress Notes (Signed)
I,Christy Johnson,acting as a scribe for Christy Sprout, FNP.,have documented all relevant documentation on the behalf of Christy Sprout, FNP,as directed by  Christy Sprout, FNP while in the presence of Christy Sprout, FNP.   Established patient visit  Patient: Christy Johnson   DOB: 1955/08/05   67 y.o. Female  MRN: [id number] Visit Date: 08/07/2022  Today's healthcare provider: [name], FNP  Patient presents for new patient visit to establish care.  Introduced to Designer, jewellery role and practice setting.  All questions answered.  Discussed provider/patient relationship and expectations.   Chief Complaint  Patient presents with   Cyst   Subjective    HPI Patient declined influenza, covid,pneumonia or shingles.  Patient coming in with concerns of knot/cyst on upper inside of leg, left side. Patient denies pain, discharge, no redness or swelling. Treatments tried: none.  Medications: Outpatient Medications Prior to Visit  Medication Sig   lisinopril (ZESTRIL) 10 MG tablet Take 1 tablet by mouth once daily   Multiple Vitamins tablet Take 1 tablet by mouth daily.   Multiple Vitamins-Minerals (ZINC PO) Take by mouth.   Omega-3 Fatty Acids (FISH OIL) 1200 MG CAPS Take 1 capsule by mouth daily.   Probiotic CAPS Take by mouth.   aspirin 81 MG EC tablet Take 1 tablet by mouth daily.   No facility-administered medications prior to visit.    Review of Systems    Objective    BP 123/80 (BP Location: Left Arm, Patient Position: Sitting, Cuff Size: Large)   Pulse 94   Resp 16   Ht '5\' 3"'$  (1.6 m)   Wt 194 lb 3.2 oz (88.1 kg)   LMP  (LMP Unknown)   BMI 34.40 kg/m   Physical Exam Vitals and nursing note reviewed.  Constitutional:      General: She is not in acute distress.    Appearance: Normal appearance. She is obese. She is not ill-appearing, toxic-appearing or diaphoretic.  HENT:     Head: Normocephalic and atraumatic.  Cardiovascular:     Rate and Rhythm: Normal  rate and regular rhythm.     Pulses: Normal pulses.     Heart sounds: Normal heart sounds. No murmur heard.    No friction rub. No gallop.  Pulmonary:     Effort: Pulmonary effort is normal. No respiratory distress.     Breath sounds: Normal breath sounds. No stridor. No wheezing, rhonchi or rales.  Chest:     Chest wall: No tenderness.  Abdominal:     Hernia: There is no hernia in the left inguinal area or right inguinal area.  Genitourinary:    Exam position: Knee-chest position.     Pubic Area: No rash or pubic lice.      Tanner stage (genital): 5.     Labia:        Right: Rash, tenderness and lesion present.     Musculoskeletal:        General: No swelling, tenderness, deformity or signs of injury. Normal range of motion.     Right lower leg: No edema.     Left lower leg: No edema.  Lymphadenopathy:     Lower Body: No right inguinal adenopathy. No left inguinal adenopathy.  Skin:    General: Skin is warm and dry.     Capillary Refill: Capillary refill takes less than 2 seconds.     Coloration: Skin is not jaundiced or pale.     Findings: No bruising,  erythema, lesion or rash.  Neurological:     General: No focal deficit present.     Mental Status: She is alert and oriented to person, place, and time. Mental status is at baseline.     Cranial Nerves: No cranial nerve deficit.     Sensory: No sensory deficit.     Motor: No weakness.     Coordination: Coordination normal.  Psychiatric:        Mood and Affect: Mood normal.        Behavior: Behavior normal.        Thought Content: Thought content normal.        Judgment: Judgment normal.     No results found for any visits on 08/07/22.  Assessment & Plan     Problem List Items Addressed This Visit       Genitourinary   Vaginal candidiasis - Primary    Acute, self limiting Trial of combination steroid/antifungal cream Discussed hygiene including use of cotton underwear and loose fitting pants RTC as needed   Encouraged to moisturize with aquaphor following Rx treatment to assist with vaginal moisture levels      Relevant Medications   nystatin-triamcinolone ointment (MYCOLOG)   mineral oil-hydrophilic petrolatum (AQUAPHOR) ointment     Other   Obesity (BMI 30.0-34.9)    Chronic, worsening per pt report Reports unable to use standing desk and excess stress Body mass index is 34.4 kg/m. Continue to recommend balanced, lower carb meals. Smaller meal size, adding snacks. Choosing water as drink of choice and increasing purposeful exercise.       Screening mammogram for breast cancer    Denies breast concerns/changes Due for screening for mammogram, denies breast concerns, provided with phone number to call and schedule appointment for mammogram. Encouraged to repeat breast cancer screening every 1-2 years.       Relevant Orders   MM 3D SCREEN BREAST BILATERAL    Return if symptoms worsen or fail to improve, for annual examination.     Vonna Kotyk, FNP, have reviewed all documentation for this visit. The documentation on 08/07/22 for the exam, diagnosis, procedures, and orders are all accurate and complete.  Christy Johnson, [place] [phone] (phone) [phone] (fax)  [place]

## 2022-08-07 ENCOUNTER — Encounter: Payer: Self-pay | Admitting: Family Medicine

## 2022-08-07 ENCOUNTER — Ambulatory Visit: Payer: BC Managed Care – PPO | Admitting: Family Medicine

## 2022-08-07 VITALS — BP 123/80 | HR 94 | Resp 16 | Ht 63.0 in | Wt 194.2 lb

## 2022-08-07 DIAGNOSIS — B3731 Acute candidiasis of vulva and vagina: Secondary | ICD-10-CM | POA: Diagnosis not present

## 2022-08-07 DIAGNOSIS — E669 Obesity, unspecified: Secondary | ICD-10-CM | POA: Diagnosis not present

## 2022-08-07 DIAGNOSIS — Z1231 Encounter for screening mammogram for malignant neoplasm of breast: Secondary | ICD-10-CM | POA: Diagnosis not present

## 2022-08-07 MED ORDER — NYSTATIN-TRIAMCINOLONE 100000-0.1 UNIT/GM-% EX OINT: 1.0000 | TOPICAL_OINTMENT | Freq: Two times a day (BID) | CUTANEOUS | 0 refills | Status: AC

## 2022-08-07 MED ORDER — AQUAPHOR EX OINT: TOPICAL_OINTMENT | CUTANEOUS | 0 refills | Status: DC | PRN

## 2022-08-07 NOTE — Patient Instructions (Signed)
Please call and schedule your mammogram:  [facility]  [place], [place],  [place]  [address] Get Driving Directions Main: [phone]  Sunday:Closed Monday:7:20 AM - 5:00 PM Tuesday:7:20 AM - 5:00 PM Wednesday:7:20 AM - 5:00 PM Thursday:7:20 AM - 5:00 PM Friday:7:20 AM - 4:30 PM Saturday:Closed

## 2022-08-07 NOTE — Assessment & Plan Note (Signed)
Denies breast concerns/changes Due for screening for mammogram, denies breast concerns, provided with phone number to call and schedule appointment for mammogram. Encouraged to repeat breast cancer screening every 1-2 years.

## 2022-08-07 NOTE — Assessment & Plan Note (Signed)
Acute, self limiting Trial of combination steroid/antifungal cream Discussed hygiene including use of cotton underwear and loose fitting pants RTC as needed  Encouraged to moisturize with aquaphor following Rx treatment to assist with vaginal moisture levels

## 2022-08-07 NOTE — Assessment & Plan Note (Signed)
Chronic, worsening per pt report Reports unable to use standing desk and excess stress Body mass index is 34.4 kg/m. Continue to recommend balanced, lower carb meals. Smaller meal size, adding snacks. Choosing water as drink of choice and increasing purposeful exercise.

## 2023-01-26 ENCOUNTER — Ambulatory Visit (INDEPENDENT_AMBULATORY_CARE_PROVIDER_SITE_OTHER): Payer: BC Managed Care – PPO | Admitting: Family Medicine

## 2023-01-26 ENCOUNTER — Encounter: Payer: Self-pay | Admitting: Family Medicine

## 2023-01-26 VITALS — BP 131/71 | HR 75 | Ht 62.5 in | Wt 186.8 lb

## 2023-01-26 DIAGNOSIS — I10 Essential (primary) hypertension: Secondary | ICD-10-CM

## 2023-01-26 DIAGNOSIS — Z86010 Personal history of colonic polyps: Secondary | ICD-10-CM | POA: Diagnosis not present

## 2023-01-26 DIAGNOSIS — Z1231 Encounter for screening mammogram for malignant neoplasm of breast: Secondary | ICD-10-CM | POA: Diagnosis not present

## 2023-01-26 DIAGNOSIS — Z0000 Encounter for general adult medical examination without abnormal findings: Secondary | ICD-10-CM | POA: Diagnosis not present

## 2023-01-26 MED ORDER — LISINOPRIL 10 MG PO TABS: 10.0000 mg | ORAL_TABLET | Freq: Every day | ORAL | 3 refills | Status: DC

## 2023-01-26 NOTE — Progress Notes (Signed)
Complete physical exam   Patient: Christy Johnson   DOB: 1955/07/17   67 y.o. Female  MRN: [id number] Visit Date: 01/26/2023  Today's healthcare provider: Jacky Kindle, FNP   Chief Complaint  Patient presents with   Annual Exam   Subjective    Christy Johnson is a 67 y.o. female who presents today for a complete physical exam.  She reports consuming a general diet. Home exercise routine includes walking 1 hrs per day. She generally feels well. She reports sleeping well. She does have additional problems to discuss today.  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 liac stent placed    Hypertension    Past Surgical History:  Procedure Laterality Date   COLONOSCOPY WITH PROPOFOL     COLONOSCOPY WITH PROPOFOL N/A 06/13/2021   Procedure: COLONOSCOPY WITH PROPOFOL;  Surgeon: Wyline Mood, MD;  Location: [facility] ENDOSCOPY;  Service: Gastroenterology;  Laterality: N/A;   COLONOSCOPY WITH PROPOFOL N/A 01/26/2022   Procedure: COLONOSCOPY WITH PROPOFOL;  Surgeon: Wyline Mood, MD;  Location: [facility] ENDOSCOPY;  Service: Gastroenterology;  Laterality: N/A;   Surgery on DVT fron stent put in  06/08/2006   Social History   Socioeconomic History   Marital status: Married    Spouse name: Not on file   Number of children: 3   Years of education: Not on file   Highest education level: Not on file  Occupational History   Occupation: clinical team coordinator    Employer: [employer]OS[employer]  Tobacco Use   Smoking status: Never   Smokeless tobacco: Never  Vaping Use   Vaping status: Never Used  Substance and Sexual Activity   Alcohol use: Not Currently    Comment: Ocassionally once a month   Drug use: No   Sexual activity: Not on file  Other Topics Concern   Not on file  Social History Narrative   Not on file   Social Determinants of Health   Financial Resource Strain: Not on file  Food Insecurity:  Not on file  Transportation Needs: Not on file  Physical Activity: Not on file  Stress: Not on file  Social Connections: Not on file  Intimate Partner Violence: Not on file   Family Status  Relation Name Status   Mother  Alive   Father  Deceased   Sister  Alive       Parathyroid   PGM  Deceased   Son  (Not Specified)   Cousin  (Not Specified)   Neg Hx  (Not Specified)  No partnership data on file   Family History  Problem Relation Age of Onset   Hypertension Mother    Hypertension Father    Cerebrovascular Accident Father    Colon polyps Paternal Grandmother    Melanoma Son    Thyroid cancer Cousin    Breast cancer Neg Hx    Allergies  Allergen Reactions   Penicillins Rash    Patient Care Team: Jacky Kindle, FNP as PCP - General (Family Medicine)   Medications: Outpatient Medications Prior to Visit  Medication Sig   mineral oil-hydrophilic petrolatum (AQUAPHOR) ointment Apply topically as needed for dry skin.   Multiple Vitamins tablet Take 1 tablet by mouth daily.   Multiple Vitamins-Minerals (ZINC PO) Take by mouth.   nystatin-triamcinolone ointment (MYCOLOG) Apply 1 Application topically 2 (two) times daily.   Omega-3 Fatty Acids (FISH OIL)  1200 MG CAPS Take 1 capsule by mouth daily.   Probiotic CAPS Take by mouth.   [DISCONTINUED] lisinopril (ZESTRIL) 10 MG tablet Take 1 tablet by mouth once daily   No facility-administered medications prior to visit.    Objective    BP 131/71 (BP Location: Left Arm, Patient Position: Sitting, Cuff Size: Normal)   Pulse 75   Ht 5' 2.5" (1.588 m)   Wt 186 lb 12.8 oz (84.7 kg)   LMP  (LMP Unknown)   SpO2 97%   BMI 33.62 kg/m   Physical Exam Vitals and nursing note reviewed.  Constitutional:      General: She is awake. She is not in acute distress.    Appearance: Normal appearance. She is well-developed and well-groomed. She is obese. She is not ill-appearing, toxic-appearing or diaphoretic.  HENT:     Head:  Normocephalic and atraumatic.     Jaw: There is normal jaw occlusion. No trismus, tenderness, swelling or pain on movement.     Right Ear: Hearing, tympanic membrane, ear canal and external ear normal. There is no impacted cerumen.     Left Ear: Hearing, tympanic membrane, ear canal and external ear normal. There is no impacted cerumen.     Nose: Nose normal. No congestion or rhinorrhea.     Right Turbinates: Not enlarged, swollen or pale.     Left Turbinates: Not enlarged, swollen or pale.     Right Sinus: No maxillary sinus tenderness or frontal sinus tenderness.     Left Sinus: No maxillary sinus tenderness or frontal sinus tenderness.     Mouth/Throat:     Lips: Pink.     Mouth: Mucous membranes are moist. No injury.     Tongue: No lesions.     Pharynx: Oropharynx is clear. Uvula midline. No pharyngeal swelling, oropharyngeal exudate, posterior oropharyngeal erythema or uvula swelling.     Tonsils: No tonsillar exudate or tonsillar abscesses.  Eyes:     General: Lids are normal. Lids are everted, no foreign bodies appreciated. Vision grossly intact. Gaze aligned appropriately. No allergic shiner or visual field deficit.       Right eye: No discharge.        Left eye: No discharge.     Extraocular Movements: Extraocular movements intact.     Conjunctiva/sclera: Conjunctivae normal.     Right eye: Right conjunctiva is not injected. No exudate.    Left eye: Left conjunctiva is not injected. No exudate.    Pupils: Pupils are equal, round, and reactive to light.  Neck:     Thyroid: No thyroid mass, thyromegaly or thyroid tenderness.     Vascular: No carotid bruit.     Trachea: Trachea normal.  Cardiovascular:     Rate and Rhythm: Normal rate and regular rhythm.     Pulses: Normal pulses.          Carotid pulses are 2+ on the right side and 2+ on the left side.      Radial pulses are 2+ on the right side and 2+ on the left side.       Dorsalis pedis pulses are 2+ on the right side and  2+ on the left side.       Posterior tibial pulses are 2+ on the right side and 2+ on the left side.     Heart sounds: Normal heart sounds, S1 normal and S2 normal. No murmur heard.    No friction rub. No gallop.  Pulmonary:  Effort: Pulmonary effort is normal. No respiratory distress.     Breath sounds: Normal breath sounds and air entry. No stridor. No wheezing, rhonchi or rales.  Chest:     Chest wall: No tenderness.  Abdominal:     General: Abdomen is flat. Bowel sounds are normal. There is no distension.     Palpations: Abdomen is soft. There is no mass.     Tenderness: There is no abdominal tenderness. There is no right CVA tenderness, left CVA tenderness, guarding or rebound.     Hernia: No hernia is present.  Genitourinary:    Comments: Exam deferred; denies complaints Musculoskeletal:        General: No swelling, tenderness, deformity or signs of injury. Normal range of motion.     Cervical back: Full passive range of motion without pain, normal range of motion and neck supple. No edema, rigidity or tenderness. No muscular tenderness.     Right lower leg: No edema.     Left lower leg: No edema.  Lymphadenopathy:     Cervical: No cervical adenopathy.     Right cervical: No superficial, deep or posterior cervical adenopathy.    Left cervical: No superficial, deep or posterior cervical adenopathy.  Skin:    General: Skin is warm and dry.     Capillary Refill: Capillary refill takes less than 2 seconds.     Coloration: Skin is not jaundiced or pale.     Findings: No bruising, erythema, lesion or rash.  Neurological:     General: No focal deficit present.     Mental Status: She is alert and oriented to person, place, and time. Mental status is at baseline.     GCS: GCS eye subscore is 4. GCS verbal subscore is 5. GCS motor subscore is 6.     Sensory: Sensation is intact. No sensory deficit.     Motor: Motor function is intact. No weakness.     Coordination: Coordination is  intact. Coordination normal.     Gait: Gait is intact. Gait normal.  Psychiatric:        Attention and Perception: Attention and perception normal.        Mood and Affect: Mood and affect normal.        Speech: Speech normal.        Behavior: Behavior normal. Behavior is cooperative.        Thought Content: Thought content normal.        Cognition and Memory: Cognition and memory normal.        Judgment: Judgment normal.    Last depression screening scores    08/07/2022    9:01 AM 01/08/2021    8:16 AM 11/24/2019    2:07 PM  PHQ 2/9 Scores  PHQ - 2 Score 0 0 1  PHQ- 9 Score  0    Last fall risk screening    08/07/2022    9:01 AM  Fall Risk   Falls in the past year? 0  Number falls in past yr: 0  Injury with Fall? 0  Risk for fall due to : No Fall Risks   Last Audit-C alcohol use screening    08/07/2022    9:02 AM  Alcohol Use Disorder Test (AUDIT)  1. How often do you have a drink containing alcohol? 0  2. How many drinks containing alcohol do you have on a typical day when you are drinking? 0  3. How often do you have six or more drinks on  one occasion? 0  AUDIT-C Score 0   A score of 3 or more in women, and 4 or more in men indicates increased risk for alcohol abuse, EXCEPT if all of the points are from question 1   No results found for any visits on 01/26/23.  Assessment & Plan    Routine Health Maintenance and Physical Exam  Exercise Activities and Dietary recommendations  Goals   None     Immunization History  Administered Date(s) Administered   Td 08/06/2016   Tdap 02/11/2006    Health Maintenance  Topic Date Due   Zoster Vaccines- Shingrix (1 of 2) Never done   Pneumonia Vaccine 41+ Years old (1 of 1 - PCV) Never done   DEXA SCAN  Never done   COVID-19 Vaccine (1 - 2023-24 season) Never done   MAMMOGRAM  02/19/2022   Colonoscopy  01/27/2023   INFLUENZA VACCINE  09/06/2023 (Originally 01/07/2023)   DTaP/Tdap/Td (3 - Td or Tdap) 08/07/2026   Hepatitis C  Screening  Completed   HPV VACCINES  Aged Out    Discussed health benefits of physical activity, and encouraged her to engage in regular exercise appropriate for her age and condition.  Problem List Items Addressed This Visit       Cardiovascular and Mediastinum   Hypertension, essential    Chronic, stable Continue on lisinopril 10 mg daily       Relevant Medications   lisinopril (ZESTRIL) 10 MG tablet   Other Relevant Orders   TSH + free T4     Other   Annual physical exam - Primary    Due for colon screening annually Things to do to keep yourself healthy  - Exercise at least 30-45 minutes a day, 3-4 days a week.  - Eat a low-fat diet with lots of fruits and vegetables, up to 7-9 servings per day.  - Seatbelts can save your life. Wear them always.  - Smoke detectors on every level of your home, check batteries every year.  - Eye Doctor - have an eye exam every 1-2 years  - Safe sex - if you may be exposed to STDs, use a condom.  - Alcohol -  If you drink, do it moderately, less than 2 drinks per day.  - Health Care Power of Attorney. Choose someone to speak for you if you are not able.  - Depression is common in our stressful world.If you're feeling down or losing interest in things you normally enjoy, please come in for a visit.  - Violence - If anyone is threatening or hurting you, please call immediately. UTD on dental and vision       Relevant Orders   CBC with Differential/Platelet   Comprehensive metabolic panel   Hemoglobin A1c   Lipid Panel With LDL/HDL Ratio   Ambulatory referral to Gastroenterology   Encounter for screening mammogram for malignant neoplasm of breast   History of adenomatous polyp of colon   Relevant Orders   Ambulatory referral to Gastroenterology   Return in about 6 months (around 07/29/2023) for chonic disease management.    Leilani Merl, FNP, have reviewed all documentation for this visit. The documentation on 01/26/23 for the exam,  diagnosis, procedures, and orders are all accurate and complete.  Jacky Kindle, FNP  [facility] [phone] (phone) [phone] (fax)  [facility]

## 2023-01-26 NOTE — Patient Instructions (Signed)
Please call and schedule your mammogram:  [facility]  [address], [address] [facility] [place],  [place]  [address] Get Driving Directions Main: [phone]  Sunday:Closed Monday:7:20 AM - 5:00 PM Tuesday:7:20 AM - 5:00 PM Wednesday:7:20 AM - 5:00 PM Thursday:7:20 AM - 5:00 PM Friday:7:20 AM - 4:30 PM Saturday:Closed  

## 2023-01-26 NOTE — Assessment & Plan Note (Signed)
Due for colon screening annually Things to do to keep yourself healthy  - Exercise at least 30-45 minutes a day, 3-4 days a week.  - Eat a low-fat diet with lots of fruits and vegetables, up to 7-9 servings per day.  - Seatbelts can save your life. Wear them always.  - Smoke detectors on every level of your home, check batteries every year.  - Eye Doctor - have an eye exam every 1-2 years  - Safe sex - if you may be exposed to STDs, use a condom.  - Alcohol -  If you drink, do it moderately, less than 2 drinks per day.  - Health Care Power of Attorney. Choose someone to speak for you if you are not able.  - Depression is common in our stressful world.If you're feeling down or losing interest in things you normally enjoy, please come in for a visit.  - Violence - If anyone is threatening or hurting you, please call immediately. UTD on dental and vision

## 2023-01-26 NOTE — Assessment & Plan Note (Signed)
Chronic, stable Continue on lisinopril 10 mg daily

## 2023-01-27 LAB — COMPREHENSIVE METABOLIC PANEL
ALT: 21 IU/L (ref 0–32)
AST: 23 IU/L (ref 0–40)
Albumin: 4.6 g/dL (ref 3.9–4.9)
Alkaline Phosphatase: 90 IU/L (ref 44–121)
BUN/Creatinine Ratio: 22 (ref 12–28)
BUN: 19 mg/dL (ref 8–27)
Bilirubin Total: 0.5 mg/dL (ref 0.0–1.2)
CO2: 20 mmol/L (ref 20–29)
Calcium: 9.5 mg/dL (ref 8.7–10.3)
Chloride: 104 mmol/L (ref 96–106)
Creatinine, Ser: 0.88 mg/dL (ref 0.57–1.00)
Globulin, Total: 2.3 g/dL (ref 1.5–4.5)
Glucose: 91 mg/dL (ref 70–99)
Potassium: 4.5 mmol/L (ref 3.5–5.2)
Sodium: 139 mmol/L (ref 134–144)
Total Protein: 6.9 g/dL (ref 6.0–8.5)
eGFR: 72 mL/min/{1.73_m2} (ref >59)

## 2023-01-27 LAB — CBC WITH DIFFERENTIAL/PLATELET
Basophils Absolute: 0 10*3/uL (ref 0.0–0.2)
Basos: 1 %
EOS (ABSOLUTE): 0.4 10*3/uL (ref 0.0–0.4)
Eos: 8 %
Hematocrit: 38.4 % (ref 34.0–46.6)
Hemoglobin: 13 g/dL (ref 11.1–15.9)
Immature Grans (Abs): 0 10*3/uL (ref 0.0–0.1)
Immature Granulocytes: 0 %
Lymphocytes Absolute: 0.9 10*3/uL (ref 0.7–3.1)
Lymphs: 22 %
MCH: 28.6 pg (ref 26.6–33.0)
MCHC: 33.9 g/dL (ref 31.5–35.7)
MCV: 85 fL (ref 79–97)
Monocytes Absolute: 0.5 10*3/uL (ref 0.1–0.9)
Monocytes: 11 %
Neutrophils Absolute: 2.5 10*3/uL (ref 1.4–7.0)
Neutrophils: 58 %
Platelets: 290 10*3/uL (ref 150–450)
RBC: 4.54 x10E6/uL (ref 3.77–5.28)
RDW: 13.6 % (ref 11.7–15.4)
WBC: 4.3 10*3/uL (ref 3.4–10.8)

## 2023-01-27 LAB — HEMOGLOBIN A1C
Est. average glucose Bld gHb Est-mCnc: 120 mg/dL
Hgb A1c MFr Bld: 5.8 % — ABNORMAL HIGH (ref 4.8–5.6)

## 2023-01-27 LAB — LIPID PANEL WITH LDL/HDL RATIO
Cholesterol, Total: 196 mg/dL (ref 100–199)
HDL: 50 mg/dL (ref >39)
LDL Chol Calc (NIH): 130 mg/dL — ABNORMAL HIGH (ref 0–99)
LDL/HDL Ratio: 2.6 ratio (ref 0.0–3.2)
Triglycerides: 88 mg/dL (ref 0–149)
VLDL Cholesterol Cal: 16 mg/dL (ref 5–40)

## 2023-01-27 LAB — TSH+FREE T4
Free T4: 1.41 ng/dL (ref 0.82–1.77)
TSH: 5.34 u[IU]/mL — ABNORMAL HIGH (ref 0.450–4.500)

## 2023-02-03 ENCOUNTER — Encounter: Payer: Self-pay | Admitting: *Deleted

## 2023-02-16 DIAGNOSIS — Z86006 Personal history of melanoma in-situ: Secondary | ICD-10-CM | POA: Diagnosis not present

## 2023-02-16 DIAGNOSIS — D225 Melanocytic nevi of trunk: Secondary | ICD-10-CM | POA: Diagnosis not present

## 2023-02-16 DIAGNOSIS — Z85820 Personal history of malignant melanoma of skin: Secondary | ICD-10-CM | POA: Diagnosis not present

## 2023-02-16 DIAGNOSIS — D2262 Melanocytic nevi of left upper limb, including shoulder: Secondary | ICD-10-CM | POA: Diagnosis not present

## 2023-08-27 DIAGNOSIS — D2261 Melanocytic nevi of right upper limb, including shoulder: Secondary | ICD-10-CM | POA: Diagnosis not present

## 2023-08-27 DIAGNOSIS — D235 Other benign neoplasm of skin of trunk: Secondary | ICD-10-CM | POA: Diagnosis not present

## 2023-08-27 DIAGNOSIS — D225 Melanocytic nevi of trunk: Secondary | ICD-10-CM | POA: Diagnosis not present

## 2023-08-27 DIAGNOSIS — Z86006 Personal history of melanoma in-situ: Secondary | ICD-10-CM | POA: Diagnosis not present

## 2023-08-27 DIAGNOSIS — D485 Neoplasm of uncertain behavior of skin: Secondary | ICD-10-CM | POA: Diagnosis not present

## 2023-08-27 DIAGNOSIS — Z85820 Personal history of malignant melanoma of skin: Secondary | ICD-10-CM | POA: Diagnosis not present

## 2023-08-27 DIAGNOSIS — D2272 Melanocytic nevi of left lower limb, including hip: Secondary | ICD-10-CM | POA: Diagnosis not present

## 2023-08-27 DIAGNOSIS — D2262 Melanocytic nevi of left upper limb, including shoulder: Secondary | ICD-10-CM | POA: Diagnosis not present

## 2023-09-10 ENCOUNTER — Telehealth: Payer: Self-pay

## 2023-09-10 ENCOUNTER — Other Ambulatory Visit: Payer: Self-pay

## 2023-09-10 DIAGNOSIS — Z860100 Personal history of colon polyps, unspecified: Secondary | ICD-10-CM

## 2023-09-10 MED ORDER — SUTAB 1479-225-188 MG PO TABS: 12.0000 | ORAL_TABLET | Freq: Two times a day (BID) | ORAL | 0 refills | Status: AC

## 2023-09-10 NOTE — Telephone Encounter (Signed)
 Gastroenterology Pre-Procedure Review  Request Date: 09/29/23 Requesting Physician: Dr. Tobi Bastos  PATIENT REVIEW QUESTIONS: The patient responded to the following health history questions as indicated:    1. Are you having any GI issues? no 2. Do you have a personal history of Polyps? yes (last colonoscopy performed by Dr. Tobi Bastos 06/17/21 recommended repeat in 6 months due to precancerous polyp) 3. Do you have a family history of Colon Cancer or Polyps? yes (paternal great grandmother colon cancer) 4. Diabetes Mellitus? no 5. Joint replacements in the past 12 months?no 6. Major health problems in the past 3 months?no 7. Any artificial heart valves, MVP, or defibrillator?no    MEDICATIONS & ALLERGIES:    Patient reports the following regarding taking any anticoagulation/antiplatelet therapy:   Plavix, Coumadin, Eliquis, Xarelto, Lovenox, Pradaxa, Brilinta, or Effient? no Aspirin? no  Patient confirms/reports the following medications:  Current Outpatient Medications  Medication Sig Dispense Refill   lisinopril (ZESTRIL) 10 MG tablet Take 1 tablet (10 mg total) by mouth daily. 90 tablet 3   mineral oil-hydrophilic petrolatum (AQUAPHOR) ointment Apply topically as needed for dry skin. 420 g 0   Multiple Vitamins tablet Take 1 tablet by mouth daily.     Multiple Vitamins-Minerals (ZINC PO) Take by mouth.     nystatin-triamcinolone ointment (MYCOLOG) Apply 1 Application topically 2 (two) times daily. 30 g 0   Omega-3 Fatty Acids (FISH OIL) 1200 MG CAPS Take 1 capsule by mouth daily.     Probiotic CAPS Take by mouth.     No current facility-administered medications for this visit.    Patient confirms/reports the following allergies:  Allergies  Allergen Reactions   Penicillins Rash    No orders of the defined types were placed in this encounter.   AUTHORIZATION INFORMATION Primary Insurance: 1D#: Group #:  Secondary Insurance: 1D#: Group #:  SCHEDULE INFORMATION: Date:  09/29/23 Time: Location: [facility]

## 2023-09-22 ENCOUNTER — Telehealth: Payer: Self-pay | Admitting: Gastroenterology

## 2023-09-22 NOTE — Telephone Encounter (Signed)
 Call returned to patient to reschedule her colonoscopy with Dr. Antony Baumgartner due to her daughter has senior night.  Colonoscopy has been rescheduled from 04/23 to 05/14.  Monique in Endo has noted this r/s request but she's new.  I told her I would call back tomorrow to follow up.  Instructions updated in mychart.  Referral updated.  Thanks, [place], [place]

## 2023-09-22 NOTE — Telephone Encounter (Signed)
Pt requesting call back to  reschedule  procedure

## 2023-09-23 NOTE — Telephone Encounter (Signed)
 Procedure was moved to the new date 10/20/23.  Thanks, [place], [place]

## 2023-10-13 ENCOUNTER — Encounter (HOSPITAL_COMMUNITY): Payer: Self-pay

## 2023-10-19 ENCOUNTER — Encounter: Payer: Self-pay | Admitting: Gastroenterology

## 2023-10-20 ENCOUNTER — Encounter
Admission: RE | Disposition: A | Discharge status: Home / Self Care | Payer: Self-pay | Source: Home / Self Care | Attending: Gastroenterology

## 2023-10-20 ENCOUNTER — Ambulatory Visit
Admission: RE | Admit: 2023-10-20 | Discharge: 2023-10-20 | Disposition: A | Discharge status: Home / Self Care | Attending: Gastroenterology | Admitting: Gastroenterology

## 2023-10-20 ENCOUNTER — Ambulatory Visit: Admitting: Anesthesiology

## 2023-10-20 DIAGNOSIS — Z860101 Personal history of adenomatous and serrated colon polyps: Secondary | ICD-10-CM | POA: Diagnosis present

## 2023-10-20 DIAGNOSIS — I10 Essential (primary) hypertension: Secondary | ICD-10-CM | POA: Insufficient documentation

## 2023-10-20 DIAGNOSIS — E669 Obesity, unspecified: Secondary | ICD-10-CM | POA: Diagnosis not present

## 2023-10-20 DIAGNOSIS — Z86718 Personal history of other venous thrombosis and embolism: Secondary | ICD-10-CM | POA: Insufficient documentation

## 2023-10-20 DIAGNOSIS — Z1211 Encounter for screening for malignant neoplasm of colon: Secondary | ICD-10-CM | POA: Diagnosis not present

## 2023-10-20 DIAGNOSIS — Z860100 Personal history of colon polyps, unspecified: Secondary | ICD-10-CM

## 2023-10-20 DIAGNOSIS — Z6832 Body mass index (BMI) 32.0-32.9, adult: Secondary | ICD-10-CM | POA: Insufficient documentation

## 2023-10-20 DIAGNOSIS — K5730 Diverticulosis of large intestine without perforation or abscess without bleeding: Secondary | ICD-10-CM | POA: Diagnosis not present

## 2023-10-20 HISTORY — PX: COLONOSCOPY: SHX5424

## 2023-10-20 SURGERY — COLONOSCOPY
Anesthesia: General

## 2023-10-20 MED ORDER — SODIUM CHLORIDE 0.9 % IV SOLN: INTRAVENOUS | Status: DC

## 2023-10-20 MED ORDER — PROPOFOL 10 MG/ML IV BOLUS
INTRAVENOUS | Status: DC | PRN
  Administered 2023-10-20: 30 mg via INTRAVENOUS
  Administered 2023-10-20: 50 mg via INTRAVENOUS

## 2023-10-20 MED ORDER — LIDOCAINE HCL (CARDIAC) PF 100 MG/5ML IV SOSY
PREFILLED_SYRINGE | INTRAVENOUS | Status: DC | PRN
Start: 2023-10-20 — End: 2023-10-20
  Administered 2023-10-20: 80 mg via INTRAVENOUS

## 2023-10-20 MED ORDER — LIDOCAINE HCL (PF) 2 % IJ SOLN
INTRAMUSCULAR | Status: AC
  Filled 2023-10-20: qty 5

## 2023-10-20 MED ORDER — PROPOFOL 500 MG/50ML IV EMUL
INTRAVENOUS | Status: DC | PRN
  Administered 2023-10-20: 75 ug/kg/min via INTRAVENOUS

## 2023-10-20 MED ORDER — DEXMEDETOMIDINE HCL IN NACL 80 MCG/20ML IV SOLN
INTRAVENOUS | Status: DC | PRN
Start: 2023-10-20 — End: 2023-10-20
  Administered 2023-10-20: 20 ug via INTRAVENOUS

## 2023-10-20 NOTE — Anesthesia Postprocedure Evaluation (Signed)
 Anesthesia Post Note  Patient: Christy Johnson  Procedure(s) Performed: COLONOSCOPY  Patient location during evaluation: PACU Anesthesia Type: General Level of consciousness: awake and alert, oriented and patient cooperative Pain management: pain level controlled Vital Signs Assessment: post-procedure vital signs reviewed and stable Respiratory status: spontaneous breathing, nonlabored ventilation and respiratory function stable Cardiovascular status: blood pressure returned to baseline and stable Postop Assessment: adequate PO intake Anesthetic complications: no   No notable events documented.   Last Vitals:  Vitals:   10/20/23 1324 10/20/23 1336  BP: (!) 107/54 111/61  Pulse: 73   Resp: 18   Temp:    SpO2: 97%     Last Pain:  Vitals:   10/20/23 1336  TempSrc:   PainSc: 0-No pain                 Dorothey Gate

## 2023-10-20 NOTE — Transfer of Care (Signed)
 Immediate Anesthesia Transfer of Care Note  Patient: Christy Johnson  Procedure(s) Performed: COLONOSCOPY  Patient Location: PACU  Anesthesia Type:General  Level of Consciousness: sedated  Airway & Oxygen Therapy: Patient Spontanous Breathing  Post-op Assessment: Report given to RN and Post -op Vital signs reviewed and stable  Post vital signs: Reviewed and stable  Last Vitals:  Vitals Value Taken Time  BP 98/58 10/20/23 1312  Temp 36.1 C 10/20/23 1312  Pulse 70 10/20/23 1312  Resp 15 10/20/23 1312  SpO2 98 % 10/20/23 1312  Vitals shown include unfiled device data.  Last Pain:  Vitals:   10/20/23 1312  TempSrc: Temporal  PainSc: 0-No pain         Complications: No notable events documented.

## 2023-10-20 NOTE — Anesthesia Preprocedure Evaluation (Addendum)
 Anesthesia Evaluation  Patient identified by MRN, date of birth, ID band Patient awake    Reviewed: Allergy & Precautions, NPO status , Patient's Chart, lab work & pertinent test results  History of Anesthesia Complications Negative for: history of anesthetic complications  Airway Mallampati: I   Neck ROM: Full    Dental  (+) Missing   Pulmonary neg pulmonary ROS   Pulmonary exam normal breath sounds clear to auscultation       Cardiovascular hypertension, Normal cardiovascular exam Rhythm:Regular Rate:Normal  Hx DVT   Neuro/Psych negative neurological ROS     GI/Hepatic negative GI ROS,,,  Endo/Other  Obesity   Renal/GU negative Renal ROS     Musculoskeletal   Abdominal   Peds  Hematology negative hematology ROS (+)   Anesthesia Other Findings   Reproductive/Obstetrics                             Anesthesia Physical Anesthesia Plan  ASA: 2  Anesthesia Plan: General   Post-op Pain Management:    Induction: Intravenous  PONV Risk Score and Plan: 3 and Propofol  infusion, TIVA and Treatment may vary due to age or medical condition  Airway Management Planned: Natural Airway  Additional Equipment:   Intra-op Plan:   Post-operative Plan:   Informed Consent: I have reviewed the patients History and Physical, chart, labs and discussed the procedure including the risks, benefits and alternatives for the proposed anesthesia with the patient or authorized representative who has indicated his/her understanding and acceptance.       Plan Discussed with: CRNA  Anesthesia Plan Comments: (LMA/GETA backup discussed.  Patient consented for risks of anesthesia including but not limited to:  - adverse reactions to medications - damage to eyes, teeth, lips or other oral mucosa - nerve damage due to positioning  - sore throat or hoarseness - damage to heart, brain, nerves, lungs, other  parts of body or loss of life  Informed patient about role of CRNA in peri- and intra-operative care.  Patient voiced understanding.)       Anesthesia Quick Evaluation

## 2023-10-20 NOTE — Op Note (Signed)
 [facility] Gastroenterology Patient Name: Christy Johnson Procedure Date: 10/20/2023 12:04 PM MRN: [id number] Account #: [id number] Date of Birth: 10/28/1955 Admit Type: Outpatient Age: 68 Room: [facility] ENDO ROOM 3 Gender: Female Note Status: Finalized Instrument Name: Charlyn Cooley [id number] Procedure:             Colonoscopy Indications:           Surveillance: Personal history of adenomatous polyps                         on last colonoscopy 3 years ago, Last colonoscopy:                         August 2023 Providers:             Luke Salaam MD, MD Referring MD:          Dori Garbe (Referring MD) Medicines:             Monitored Anesthesia Care Complications:         No immediate complications. Procedure:             Pre-Anesthesia Assessment:                        - Prior to the procedure, a History and Physical was                         performed, and patient medications, allergies and                         sensitivities were reviewed. The patient's tolerance                         of previous anesthesia was reviewed.                        - The risks and benefits of the procedure and the                         sedation options and risks were discussed with the                         patient. All questions were answered and informed                         consent was obtained.                        - ASA Grade Assessment: II - A patient with mild                         systemic disease.                        After obtaining informed consent, the colonoscope was                         passed under direct vision. Throughout the procedure,                         the patient's blood pressure, pulse,  and oxygen                         saturations were monitored continuously. The                         Colonoscope was introduced through the anus and                         advanced to the the cecum, identified by the                          appendiceal orifice. The colonoscopy was performed                         with ease. The patient tolerated the procedure well.                         The quality of the bowel preparation was excellent.                         The ileocecal valve, appendiceal orifice, and rectum                         were photographed. Findings:      The perianal and digital rectal examinations were normal.      The entire examined colon appeared normal on direct and retroflexion       views. Impression:            - The entire examined colon is normal on direct and                         retroflexion views.                        - No specimens collected. Recommendation:        - Discharge patient to home (with escort).                        - Resume previous diet.                        - Continue present medications.                        - Repeat colonoscopy in 5 years for surveillance. Procedure Code(s):     --- Professional ---                        [phone], Colonoscopy, flexible; diagnostic, including                         collection of specimen(s) by brushing or washing, when                         performed (separate procedure) Diagnosis Code(s):     --- Professional ---                        Z86.010, Personal history of colonic polyps CPT copyright 2022 American Medical Association. All rights reserved.  The codes documented in this report are preliminary and upon coder review may  be revised to meet current compliance requirements. Luke Salaam, MD Luke Salaam MD, MD 10/20/2023 1:11:43 PM This report has been signed electronically. Number of Addenda: 0 Note Initiated On: 10/20/2023 12:04 PM Scope Withdrawal Time: 0 hours 7 minutes 28 seconds  Total Procedure Duration: 0 hours 10 minutes 42 seconds  Estimated Blood Loss:  Estimated blood loss: none.      [facility]

## 2023-10-20 NOTE — H&P (Signed)
 Luke Salaam, MD [address], [address], [place], [place], [address] [address], [address], [place], [place], [address] Phone: [phone]  Fax: [phone]  Primary Care Physician:  Mimi Alt, MD   Pre-Procedure History & Physical: HPI:  Christy Johnson is a 68 y.o. female is here for an colonoscopy.   Past Medical History:  Diagnosis Date   Acute thromboembolism of deep veins of lower extremity (HCC) 10/22/2006   2008 at [facility] liac stent placed    Hypertension     Past Surgical History:  Procedure Laterality Date   COLONOSCOPY WITH PROPOFOL      COLONOSCOPY WITH PROPOFOL  N/A 06/13/2021   Procedure: COLONOSCOPY WITH PROPOFOL ;  Surgeon: Luke Salaam, MD;  Location: [facility] ENDOSCOPY;  Service: Gastroenterology;  Laterality: N/A;   COLONOSCOPY WITH PROPOFOL  N/A 01/26/2022   Procedure: COLONOSCOPY WITH PROPOFOL ;  Surgeon: Luke Salaam, MD;  Location: [facility] ENDOSCOPY;  Service: Gastroenterology;  Laterality: N/A;   Surgery on DVT fron stent put in  06/08/2006    Prior to Admission medications   Medication Sig Start Date End Date Taking? Authorizing Provider  lisinopril  (ZESTRIL ) 10 MG tablet Take 1 tablet (10 mg total) by mouth daily. 01/26/23   Normie Becton, FNP  mineral oil-hydrophilic petrolatum (AQUAPHOR) ointment Apply topically as needed for dry skin. 08/07/22   Normie Becton, FNP  Multiple Vitamins tablet Take 1 tablet by mouth daily. 07/29/09   [provider]  Multiple Vitamins-Minerals (ZINC PO) Take by mouth.    [provider]  nystatin -triamcinolone  ointment (MYCOLOG) Apply 1 Application topically 2 (two) times daily. 08/07/22   Normie Becton, FNP  Omega-3 Fatty Acids (FISH OIL) 1200 MG CAPS Take 1 capsule by mouth daily. 07/29/09   [provider]  Probiotic CAPS Take by mouth.    [provider]    Allergies as of 09/10/2023 - Review Complete 01/26/2023  Allergen Reaction Noted   Penicillins Rash 02/28/2015    Family  History  Problem Relation Age of Onset   Hypertension Mother    Hypertension Father    Cerebrovascular Accident Father    Colon polyps Paternal Grandmother    Melanoma Son    Thyroid cancer Cousin    Breast cancer Neg Hx     Social History   Socioeconomic History   Marital status: Married    Spouse name: Not on file   Number of children: 3   Years of education: Not on file   Highest education level: Not on file  Occupational History   Occupation: clinical team coordinator    Employer: HOS[employer]  Tobacco Use   Smoking status: Never   Smokeless tobacco: Never  Vaping Use   Vaping status: Never Used  Substance and Sexual Activity   Alcohol use: Not Currently    Comment: Ocassionally once a month   Drug use: No   Sexual activity: Not on file  Other Topics Concern   Not on file  Social History Narrative   Not on file   Social Drivers of Health   Financial Resource Strain: Not on file  Food Insecurity: Not on file  Transportation Needs: Not on file  Physical Activity: Not on file  Stress: Not on file  Social Connections: Not on file  Intimate Partner Violence: Not on file    Review of Systems: See HPI, otherwise negative ROS  Physical Exam: LMP  (LMP Unknown)  General:   Alert,  pleasant and cooperative in NAD Head:  Normocephalic and atraumatic. Neck:  Supple; no masses or thyromegaly. Lungs:  Clear throughout to auscultation, normal respiratory effort.    Heart:  +S1, +S2, Regular rate and rhythm, No edema. Abdomen:  Soft, nontender and nondistended. Normal bowel sounds, without guarding, and without rebound.   Neurologic:  Alert and  oriented x4;  grossly normal neurologically.  Impression/Plan: Christy Johnson is here for an colonoscopy to be performed for surveillance due to prior history of colon polyps   Risks, benefits, limitations, and alternatives regarding  colonoscopy have been reviewed with the patient.  Questions have been answered.  All parties  agreeable.   Luke Salaam, MD  10/20/2023, 12:03 PM

## 2023-10-21 ENCOUNTER — Encounter: Payer: Self-pay | Admitting: Gastroenterology

## 2023-11-22 ENCOUNTER — Encounter: Payer: Self-pay | Admitting: Family Medicine

## 2023-11-22 ENCOUNTER — Ambulatory Visit (INDEPENDENT_AMBULATORY_CARE_PROVIDER_SITE_OTHER): Admitting: Family Medicine

## 2023-11-22 VITALS — BP 138/75 | HR 75 | Ht 63.0 in | Wt 189.0 lb

## 2023-11-22 DIAGNOSIS — I10 Essential (primary) hypertension: Secondary | ICD-10-CM

## 2023-11-22 DIAGNOSIS — E66811 Obesity, class 1: Secondary | ICD-10-CM | POA: Diagnosis not present

## 2023-11-22 DIAGNOSIS — Z1231 Encounter for screening mammogram for malignant neoplasm of breast: Secondary | ICD-10-CM | POA: Diagnosis not present

## 2023-11-22 DIAGNOSIS — E782 Mixed hyperlipidemia: Secondary | ICD-10-CM | POA: Insufficient documentation

## 2023-11-22 DIAGNOSIS — Z130 Encounter for screening for diseases of the blood and blood-forming organs and certain disorders involving the immune mechanism: Secondary | ICD-10-CM

## 2023-11-22 DIAGNOSIS — R7989 Other specified abnormal findings of blood chemistry: Secondary | ICD-10-CM | POA: Diagnosis not present

## 2023-11-22 MED ORDER — LISINOPRIL 10 MG PO TABS: 10.0000 mg | ORAL_TABLET | Freq: Every day | ORAL | 3 refills | Status: AC

## 2023-11-22 NOTE — Progress Notes (Signed)
 Established patient visit  Transfer of Care    Patient: Christy Johnson   DOB: [birth year]   68 y.o. Female  MRN: [id number] Visit Date: 11/22/2023  Today's healthcare provider: Mimi Alt, MD   Chief Complaint  Patient presents with   Establish Care    No concern    Subjective     HPI     Establish Care    Additional comments: No concern       Last edited by Bart Lieu, CMA on 11/22/2023  2:17 PM.       Discussed the use of AI scribe software for clinical note transcription with the patient, who gave verbal consent to proceed.  History of Present Illness Christy Johnson is a 68 year old female with hypertension who presents to transfer care from her previous provider.  She requires a refill for her lisinopril , which she takes 10 mg daily for hypertension. Her home blood pressure readings are around 137/78 mmHg, and she monitors it several times a week. She experiences anxiety about doctor's visits affecting her readings.  Her medical history includes hypertension, stress urinary incontinence, obesity, insomnia, and a history of colonic polyps. She had a DVT in May 2008 for which she had a stent placed. She has undergone colonoscopies and has a history of chickenpox.  Her last TSH was 5.3, and her A1c was 5.8. Her LDL was 130. Her ASCVD risk score was 10.9% at the time of her last labs. She has declined a DEXA scan and has not received shingles or pneumonia vaccines due to concerns about adverse reactions, as her sister had a severe reaction to the shingles vaccine.  She recently retired and has been Aeronautical engineer with insurance issues transitioning from [employer] to Medicare. She is also caring for her 12 year old mother who lives independently, which has been demanding. She has a new puppy and is adjusting to life changes post-retirement.  CPE ix not due until 01/26/23   Past Medical History:  Diagnosis Date   Acute thromboembolism of  deep veins of lower extremity (HCC) 10/22/2006   2008 at [facility]. liac stent placed    Hypertension     Medications: Outpatient Medications Prior to Visit  Medication Sig   Misc Natural Products (BERBEMYCIN PO) Take by mouth.   lisinopril  (ZESTRIL ) 10 MG tablet Take 1 tablet (10 mg total) by mouth daily.   mineral oil-hydrophilic petrolatum (AQUAPHOR) ointment Apply topically as needed for dry skin. (Patient not taking: Reported on 11/22/2023)   Multiple Vitamins tablet Take 1 tablet by mouth daily.   Multiple Vitamins-Minerals (ZINC PO) Take by mouth. (Patient not taking: Reported on 11/22/2023)   nystatin -triamcinolone  ointment (MYCOLOG) Apply 1 Application topically 2 (two) times daily.   Omega-3 Fatty Acids (FISH OIL) 1200 MG CAPS Take 1 capsule by mouth daily. (Patient not taking: Reported on 11/22/2023)   Probiotic CAPS Take by mouth.   No facility-administered medications prior to visit.    Review of Systems  Last metabolic panel Lab Results  Component Value Date   GLUCOSE 91 01/26/2023   NA 139 01/26/2023   K 4.5 01/26/2023   CL 104 01/26/2023   CO2 20 01/26/2023   BUN 19 01/26/2023   CREATININE 0.88 01/26/2023   EGFR 72 01/26/2023   CALCIUM 9.5 01/26/2023   PHOS 3.2 08/06/2016   PROT 6.9 01/26/2023   ALBUMIN 4.6 01/26/2023   LABGLOB 2.3 01/26/2023   AGRATIO 1.9 01/08/2021   BILITOT  0.5 01/26/2023   ALKPHOS 90 01/26/2023   AST 23 01/26/2023   ALT 21 01/26/2023   Last lipids Lab Results  Component Value Date   CHOL 196 01/26/2023   HDL 50 01/26/2023   LDLCALC 130 (H) 01/26/2023   TRIG 88 01/26/2023   CHOLHDL 3.6 01/08/2021   The 10-year ASCVD risk score (Arnett DK, et al., 2019) is: 10.9%  Last hemoglobin A1c Lab Results  Component Value Date   HGBA1C 5.8 (H) 01/26/2023   Last thyroid functions Lab Results  Component Value Date   TSH 5.340 (H) 01/26/2023        Objective    LMP  (LMP Unknown)  BP Readings from Last 3 Encounters:  11/22/23 138/75   10/20/23 111/61  01/26/23 131/71   Wt Readings from Last 3 Encounters:  11/22/23 189 lb (85.7 kg)  10/20/23 185 lb (83.9 kg)  01/26/23 186 lb 12.8 oz (84.7 kg)        Physical Exam Vitals reviewed.  Constitutional:      General: She is not in acute distress.    Appearance: Normal appearance. She is not ill-appearing.   Cardiovascular:     Rate and Rhythm: Normal rate and regular rhythm.  Pulmonary:     Effort: Pulmonary effort is normal. No respiratory distress.     Breath sounds: No wheezing, rhonchi or rales.   Neurological:     Mental Status: She is alert and oriented to person, place, and time.   Psychiatric:        Mood and Affect: Mood normal.        Behavior: Behavior normal.       No results found for any visits on 11/22/23.  Assessment & Plan     Problem List Items Addressed This Visit       Cardiovascular and Mediastinum   Hypertension, essential     Assessment & Plan Hypertension Chronic hypertension managed with lisinopril  10 mg daily. Home blood pressure readings average 137/78 mmHg, slightly above the target of <130/80 mmHg. Current office reading is 138/75 mmHg. The goal is to maintain blood pressure in the 120s/70s range, with systolic ideally <829 mmHg. - Refill lisinopril  10 mg daily - Monitor blood pressure regularly at home  Hyperlipidemia Chronic LDL cholesterol is 130 mg/dL with an ASCVD risk score of 10.9%, indicating a 10% chance of cardiovascular events in the next ten years. She is a candidate for statin therapy to reduce cardiovascular risk. Discussed starting statin therapy such as Crestor or Lipitor. An updated lipid panel is necessary to guide treatment decisions. - Order updated lipid panel - Consider starting statin therapy based on updated lipid panel results  Prediabetes A1c is 5.8%, indicating prediabetes. Monitoring is necessary to prevent progression to diabetes. - Order A1c to monitor glucose levels  Thyroid  Dysfunction Previous TSH was 5.3, indicating possible thyroid dysfunction. Biotin, which may have affected TSH levels, has been discontinued. Re-evaluation of thyroid function is necessary. - Order TSH to reassess thyroid function  General Health Maintenance She is due for a Medicare annual wellness visit. Declined DEXA scan and shingles and pneumonia vaccines due to concerns about adverse reactions, particularly after her sister's reaction to the shingles vaccine. No family history of osteoporosis. - Order mammogram for breast cancer screening - Schedule Medicare annual wellness visit in the next 3-4 months  Follow-up She needs to return for fasting labs and a welcome to Medicare visit. Labs will include lipid panel, A1c, and TSH. Discussed the importance  of fasting for accurate lab results. - Order fasting labs including lipid panel, A1c, and TSH - Schedule welcome to Medicare visit with EKG and lab review     No follow-ups on file.         Mimi Alt, MD  [facility] [phone] (phone) [phone] (fax)  [facility]

## 2023-11-30 IMAGING — US US EXTREM LOW VENOUS*R*
1 series · 15 of 24 positions shown · non-contrast
Comparison: None.

CLINICAL DATA: Right calf pain



[Series 1: us venous imag bi/left/(id) · portal-venous · 15 of 32 slices shown]
[im 1/32]
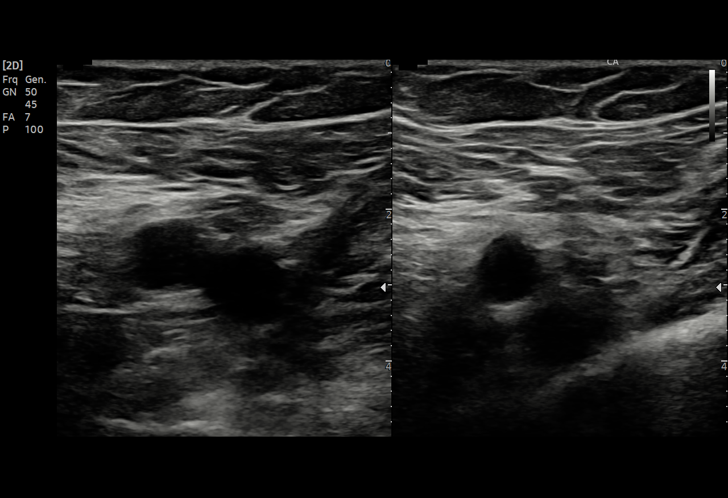
[im 3/32]
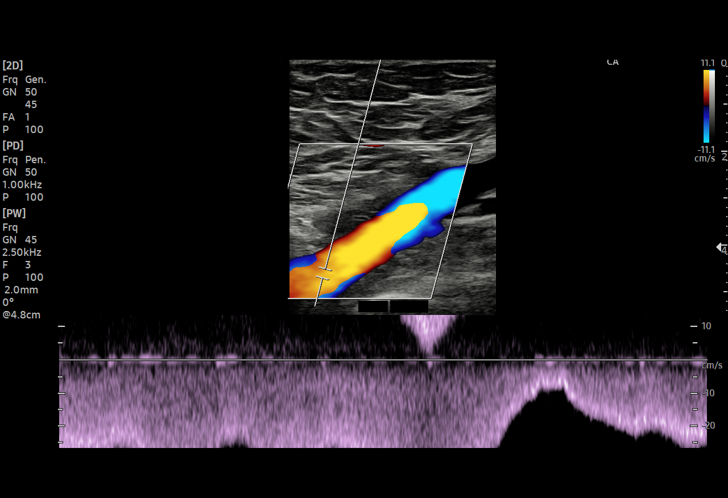
[im 6/32]
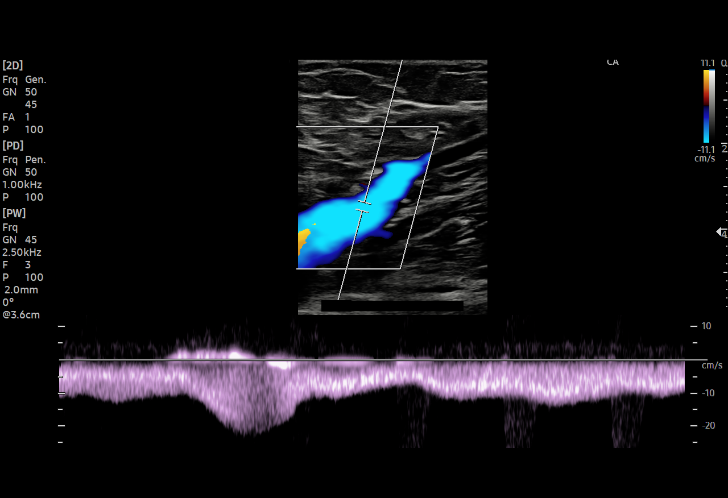
[im 7/32]
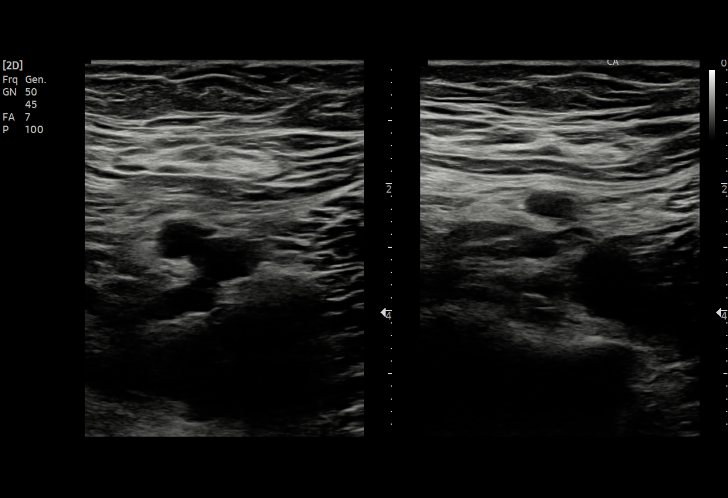
[im 10/32]
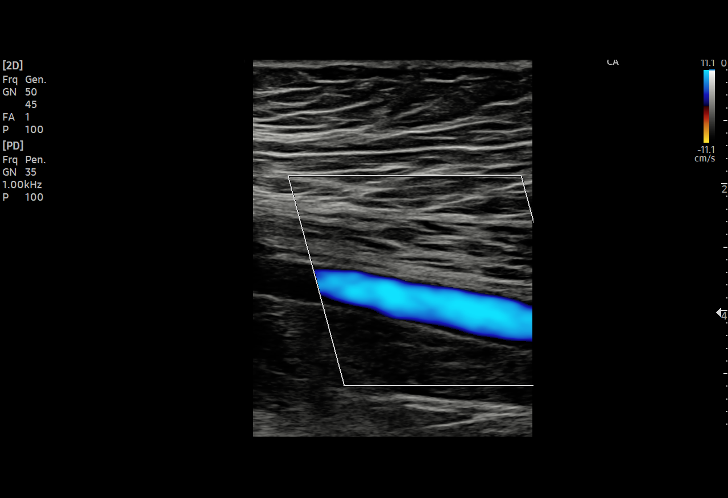
[im 11/32]
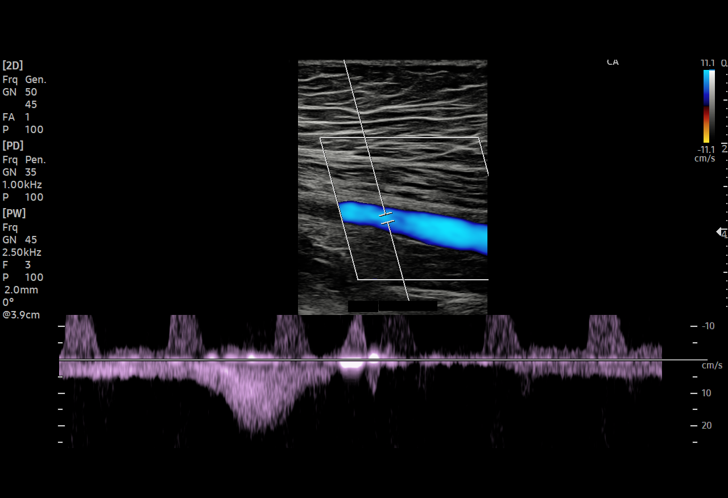
[im 14/32]
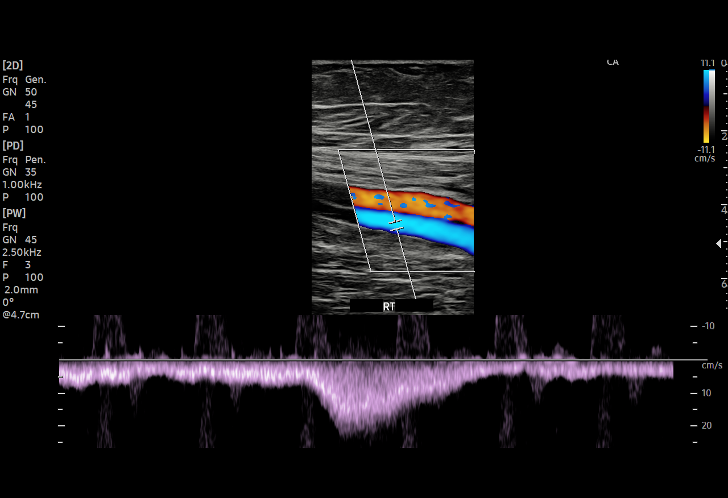
[im 17/32]
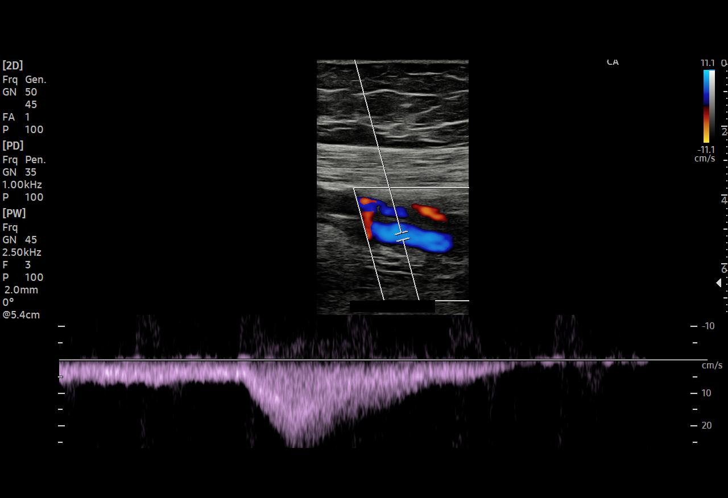
[im 18/32]
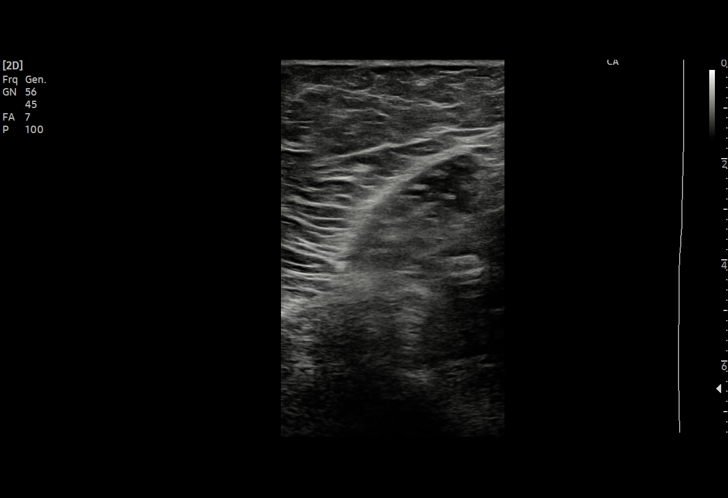
[im 21/32]
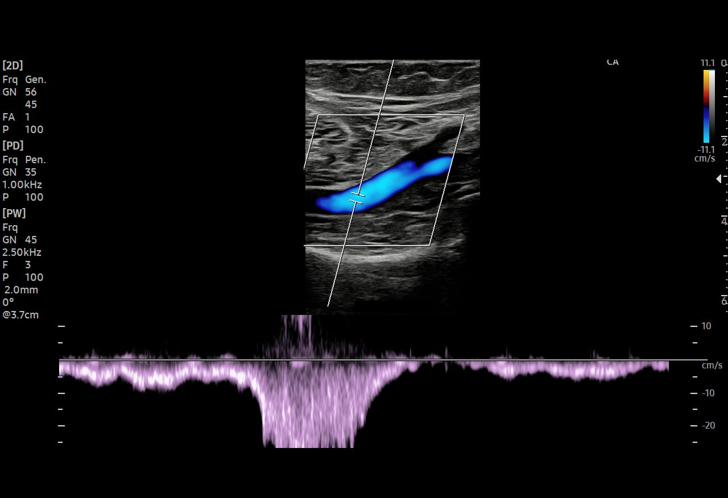
[im 22/32]
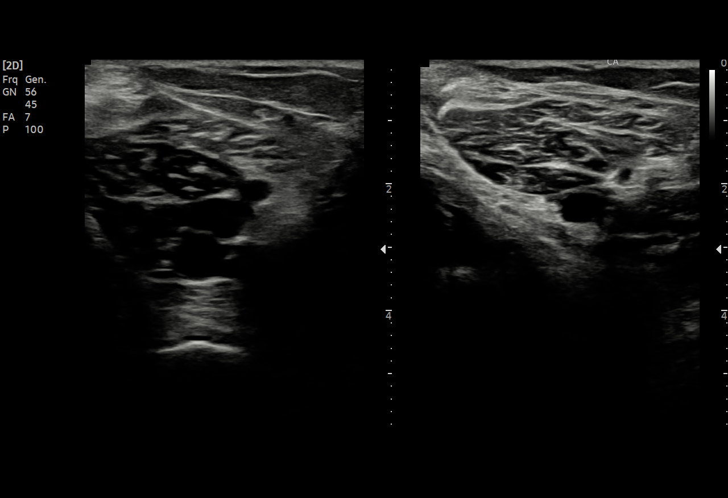
[im 25/32]
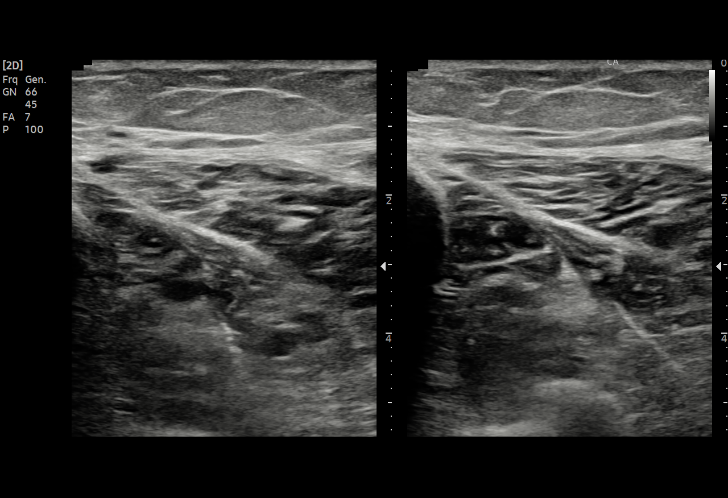
[im 27/32]
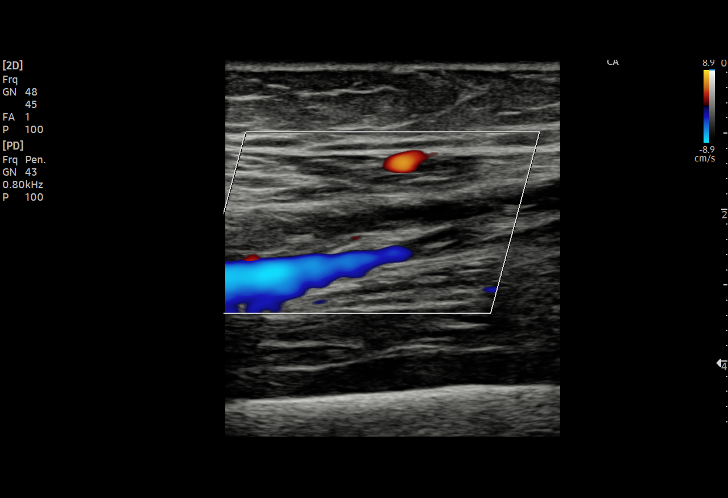
[im 29/32]
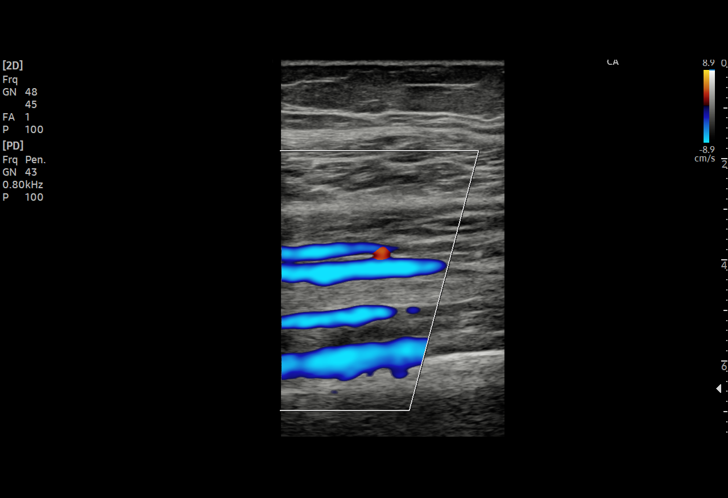
[im 32/32]
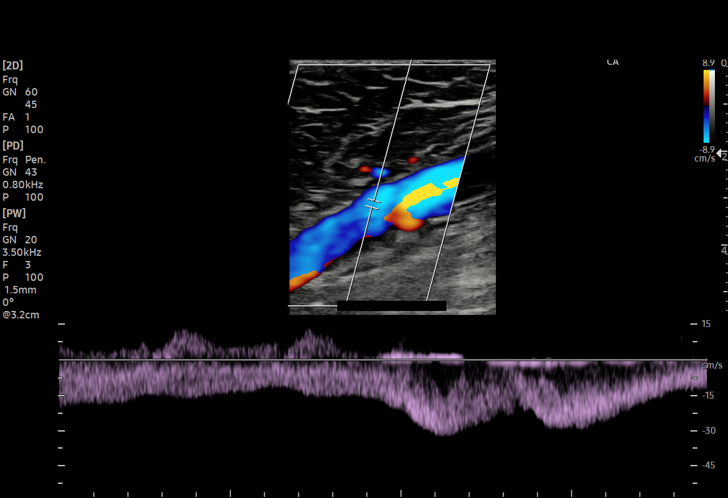

[15 of 24 positions shown; findings below may reference images not displayed]

FINDINGS: Contralateral Common Femoral Vein: Respiratory phasicity is normal
and symmetric with the symptomatic side. No evidence of thrombus.
Normal compressibility.

Common Femoral Vein: No evidence of thrombus. Normal
compressibility, respiratory phasicity and response to augmentation.

Saphenofemoral Junction: No evidence of thrombus. Normal
compressibility and flow on color Doppler imaging.

Profunda Femoral Vein: No evidence of thrombus. Normal
compressibility and flow on color Doppler imaging.

Femoral Vein: No evidence of thrombus. Normal compressibility,
respiratory phasicity and response to augmentation.

Popliteal Vein: No evidence of thrombus. Normal compressibility,
respiratory phasicity and response to augmentation.

Calf Veins: No evidence of thrombus. Normal compressibility and flow
on color Doppler imaging.
IMPRESSION: No evidence of deep venous thrombosis.

## 2024-01-17 ENCOUNTER — Encounter: Payer: Self-pay | Admitting: Family Medicine

## 2024-01-17 ENCOUNTER — Ambulatory Visit (INDEPENDENT_AMBULATORY_CARE_PROVIDER_SITE_OTHER): Admitting: Family Medicine

## 2024-01-17 VITALS — BP 122/64 | HR 74 | Resp 16 | Ht 62.0 in | Wt 185.0 lb

## 2024-01-17 DIAGNOSIS — E66811 Obesity, class 1: Secondary | ICD-10-CM | POA: Diagnosis not present

## 2024-01-17 DIAGNOSIS — Z1231 Encounter for screening mammogram for malignant neoplasm of breast: Secondary | ICD-10-CM

## 2024-01-17 DIAGNOSIS — E782 Mixed hyperlipidemia: Secondary | ICD-10-CM | POA: Diagnosis not present

## 2024-01-17 DIAGNOSIS — Z0000 Encounter for general adult medical examination without abnormal findings: Secondary | ICD-10-CM

## 2024-01-17 DIAGNOSIS — Z130 Encounter for screening for diseases of the blood and blood-forming organs and certain disorders involving the immune mechanism: Secondary | ICD-10-CM | POA: Diagnosis not present

## 2024-01-17 DIAGNOSIS — I10 Essential (primary) hypertension: Secondary | ICD-10-CM | POA: Diagnosis not present

## 2024-01-17 DIAGNOSIS — R7989 Other specified abnormal findings of blood chemistry: Secondary | ICD-10-CM | POA: Diagnosis not present

## 2024-01-17 NOTE — Progress Notes (Signed)
 Subjective:    Christy Johnson is a 68 y.o. female who presents for a Welcome to Medicare exam.    Discussed the use of AI scribe software for clinical note transcription with the patient, who gave verbal consent to proceed.  History of Present Illness Christy Johnson is a 68 year old female who presents for a Welcome to Medicare visit.  She is due for several routine health maintenance screenings and vaccinations, including a mammogram, COVID vaccine, and influenza vaccine. The influenza vaccine is currently unavailable.  Her medical history includes stress incontinence and hyperlipidemia. She is on lisinopril  2 mg daily for hypertension, which is well controlled. Her BMI is 33.84, indicating obesity.  She has pending lab work including TSH, T4, T3, lipid panel, CMP, CBC, and A1c, which were not completed during her last visit in June.  She is experiencing a high level of stress due to recent family events, including multiple injuries and health issues among her neighbors and family members. She has been actively involved in caregiving and support for her community and family, which has been overwhelming at times.          Objective:    Today's Vitals   01/17/24 0836  BP: 122/64  Pulse: 74  Resp: 16  SpO2: 97%  Weight: 185 lb (83.9 kg)  Height: 5' 2 (1.575 m)  Body mass index is 33.84 kg/m.  Medications Outpatient Encounter Medications as of 01/17/2024  Medication Sig   lisinopril  (ZESTRIL ) 10 MG tablet Take 1 tablet (10 mg total) by mouth daily.   Misc Natural Products (BERBEMYCIN PO) Take by mouth.   Multiple Vitamins tablet Take 1 tablet by mouth daily.   nystatin -triamcinolone  ointment (MYCOLOG) Apply 1 Application topically 2 (two) times daily.   Probiotic CAPS Take by mouth.   [DISCONTINUED] mineral oil-hydrophilic petrolatum (AQUAPHOR) ointment Apply topically as needed for dry skin. (Patient not taking: Reported on 11/22/2023)   [DISCONTINUED] Multiple  Vitamins-Minerals (ZINC PO) Take by mouth. (Patient not taking: Reported on 11/22/2023)   [DISCONTINUED] Omega-3 Fatty Acids (FISH OIL) 1200 MG CAPS Take 1 capsule by mouth daily. (Patient not taking: Reported on 01/17/2024)   No facility-administered encounter medications on file as of 01/17/2024.     History: Past Medical History:  Diagnosis Date   Acute thromboembolism of deep veins of lower extremity (HCC) 10/22/2006   2008 at [facility]. liac stent placed    Hypertension    Past Surgical History:  Procedure Laterality Date   COLONOSCOPY N/A 10/20/2023   Procedure: COLONOSCOPY;  Surgeon: Therisa Bi, MD;  Location: [facility] ENDOSCOPY;  Service: Gastroenterology;  Laterality: N/A;   COLONOSCOPY WITH PROPOFOL      COLONOSCOPY WITH PROPOFOL  N/A 06/13/2021   Procedure: COLONOSCOPY WITH PROPOFOL ;  Surgeon: Therisa Bi, MD;  Location: [facility] ENDOSCOPY;  Service: Gastroenterology;  Laterality: N/A;   COLONOSCOPY WITH PROPOFOL  N/A 01/26/2022   Procedure: COLONOSCOPY WITH PROPOFOL ;  Surgeon: Therisa Bi, MD;  Location: [facility] ENDOSCOPY;  Service: Gastroenterology;  Laterality: N/A;   Surgery on DVT fron stent put in  06/08/2006    Family History  Problem Relation Age of Onset   Hypertension Mother    Hypertension Father    Cerebrovascular Accident Father    Colon polyps Paternal Grandmother    Melanoma Son    Thyroid cancer Cousin    Breast cancer Neg Hx    Social History   Occupational History   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  Constitutional:      General: She is not in acute distress.    Appearance: Normal appearance. She is not ill-appearing, toxic-appearing or diaphoretic.  HENT:     Head: Normocephalic and atraumatic.     Right Ear: Tympanic membrane and external ear normal. There is no impacted cerumen.     Left Ear: Tympanic membrane and external ear normal. There is no impacted cerumen.     Nose: Nose normal.     Mouth/Throat:     Pharynx: Oropharynx is clear.  Eyes:     General: No scleral icterus.    Extraocular Movements: Extraocular movements intact.     Conjunctiva/sclera: Conjunctivae normal.     Pupils: Pupils are equal, round, and reactive to light.  Cardiovascular:     Rate and Rhythm: Normal rate and regular rhythm.     Pulses: Normal pulses.     Heart sounds: Normal heart sounds. No murmur heard.    No friction rub. No gallop.  Pulmonary:     Effort: Pulmonary effort is normal. No respiratory distress.     Breath sounds: Normal breath sounds. No wheezing, rhonchi or rales.  Abdominal:     General: Bowel sounds are normal. There is no distension.     Palpations: Abdomen is soft. There is no mass.     Tenderness: There is no abdominal tenderness. There is no guarding.  Musculoskeletal:        General: No deformity.     Cervical back: Normal range of motion and neck supple.     Right lower leg: No edema.     Left lower leg: No edema.  Lymphadenopathy:     Cervical: No cervical adenopathy.  Skin:    General: Skin is warm.     Capillary Refill: Capillary refill takes less than 2 seconds.     Findings: No erythema or rash.  Neurological:     General: No focal deficit present.     Mental Status: She is alert and oriented to person, place, and time.     Cranial Nerves: Cranial nerves 2-12 are intact. No  cranial nerve deficit or facial asymmetry.     Motor: Motor function is intact. No weakness.     Gait: Gait normal.  Psychiatric:        Mood and Affect: Mood normal.        Behavior: Behavior normal.    (optional), or other factors deemed appropriate based on the beneficiary's medical and social history and current clinical standards.   Advanced Directives:    EKG:  normal EKG, normal sinus rhythm, unchanged from previous tracings      Assessment:    This is a routine wellness examination for this patient .   Vision/Hearing screen No results found.  Goals   None     Depression Screen    11/22/2023    2:23 PM 08/07/2022    9:01 AM 01/08/2021    8:16 AM 11/24/2019    2:07 PM  PHQ 2/9 Scores  PHQ - 2 Score 0 0 0 1  PHQ- 9 Score 2  0      Fall Risk    08/07/2022    9:01 AM  Fall Risk   Falls in the past year? 0  Number falls in past yr: 0  Injury with Fall? 0  Risk for fall due to : No Fall Risks    Cognitive Function:        Patient Care Team: Sharma Coyer, MD as PCP - General (Family Medicine)     Plan:    Assessment and Plan Assessment & Plan Adult Wellness Visit Blood pressure is well controlled at 122/64 mmHg. Pulse is within normal limits. Oxygen saturation is 97%. BMI is 33.84, indicating obesity. Neurological and cardiovascular assessments are normal. Pupils are consistently large but symmetrical, with no signs of anisocoria or acute neurological issues. - Order ECG - Order TSH, T4, T3, lipid panel, CMP, CBC, and A1c - Schedule mammogram for breast cancer screening - Recommended  COVID vaccine when available, pt declined today  - Recommended influenza vaccine when available, pt declined today   Hypertension Hypertension is well controlled with current medication regimen with lisinopril  10mg  daily   Hyperlipidemia Labs are pending to assess current lipid levels. - previously ordered labs  Obesity BMI is 33.84, indicating  obesity. - TSH, CMP, A1c previously ordered     I have personally reviewed and noted the following in the patient's chart:   Medical and social history Use of alcohol, tobacco or illicit drugs  Current medications and supplements including opioid prescriptions. Patient is not currently taking opioid prescriptions. Functional ability and status Nutritional status Physical activity Advanced directives List of other physicians Hospitalizations, surgeries, and ER visits in previous 12 months Vitals Screenings to include cognitive, depression, and falls Referrals and appointments    Assessment & Plan Adult Wellness Visit Blood pressure is well controlled at 122/64 mmHg. Pulse is within normal limits. Oxygen saturation is 97%. BMI is 33.84, indicating obesity. Neurological and cardiovascular assessments are normal. Pupils are consistently large but symmetrical, with no signs of anisocoria or acute neurological issues. - Order ECG - Order TSH, T4, T3, lipid panel, CMP, CBC, and A1c - Schedule mammogram for breast cancer screening - Administer COVID vaccine when available - Administer influenza vaccine when available  Hypertension Hypertension is well controlled with current medication regimen.  Hyperlipidemia Labs are pending to assess current lipid levels. - Order lipid panel  Obesity BMI is 33.84, indicating obesity.    In addition, I have reviewed and discussed with patient certain preventive protocols, quality metrics, and best practice recommendations. A written personalized care plan for preventive services as well as general preventive health recommendations were provided to patient.     Coyer Sharma, MD 01/17/2024

## 2024-01-17 NOTE — Patient Instructions (Signed)
 It was a pleasure to see you today!  Thank you for choosing [facility] for your primary care.    Please review the attached information regarding helpful preventive health topics.   To keep you healthy, please keep in mind the following health maintenance items that you are due for:   Health Maintenance Due  Topic Date Due   MAMMOGRAM  02/19/2022   COVID-19 Vaccine (1 - 2024-25 season) Never done   INFLUENZA VACCINE  01/07/2024   [facility] [address] [place],  [place]  [address] Main: [phone]   Best Wishes,   Dr. Lang

## 2024-01-18 ENCOUNTER — Ambulatory Visit: Payer: Self-pay | Admitting: Family Medicine

## 2024-01-18 LAB — CBC
Hematocrit: 41.2 % (ref 34.0–46.6)
Hemoglobin: 13.3 g/dL (ref 11.1–15.9)
MCH: 28.8 pg (ref 26.6–33.0)
MCHC: 32.3 g/dL (ref 31.5–35.7)
MCV: 89 fL (ref 79–97)
Platelets: 324 x10E3/uL (ref 150–450)
RBC: 4.62 x10E6/uL (ref 3.77–5.28)
RDW: 13.2 % (ref 11.7–15.4)
WBC: 5.4 x10E3/uL (ref 3.4–10.8)

## 2024-01-18 LAB — CMP14+EGFR
ALT: 17 IU/L (ref 0–32)
AST: 22 IU/L (ref 0–40)
Albumin: 4.6 g/dL (ref 3.9–4.9)
Alkaline Phosphatase: 90 IU/L (ref 44–121)
BUN/Creatinine Ratio: 24 (ref 12–28)
BUN: 22 mg/dL (ref 8–27)
Bilirubin Total: 0.5 mg/dL (ref 0.0–1.2)
CO2: 23 mmol/L (ref 20–29)
Calcium: 9.6 mg/dL (ref 8.7–10.3)
Chloride: 104 mmol/L (ref 96–106)
Creatinine, Ser: 0.91 mg/dL (ref 0.57–1.00)
Globulin, Total: 2.4 g/dL (ref 1.5–4.5)
Glucose: 96 mg/dL (ref 70–99)
Potassium: 5.4 mmol/L — ABNORMAL HIGH (ref 3.5–5.2)
Sodium: 141 mmol/L (ref 134–144)
Total Protein: 7 g/dL (ref 6.0–8.5)
eGFR: 69 mL/min/1.73 (ref >59)

## 2024-01-18 LAB — TSH+T4F+T3FREE
Free T4: 1.27 ng/dL (ref 0.82–1.77)
T3, Free: 3.3 pg/mL (ref 2.0–4.4)
TSH: 3.66 u[IU]/mL (ref 0.450–4.500)

## 2024-01-18 LAB — LIPID PANEL
Chol/HDL Ratio: 3.7 ratio (ref 0.0–4.4)
Cholesterol, Total: 190 mg/dL (ref 100–199)
HDL: 51 mg/dL (ref >39)
LDL Chol Calc (NIH): 124 mg/dL — ABNORMAL HIGH (ref 0–99)
Triglycerides: 79 mg/dL (ref 0–149)
VLDL Cholesterol Cal: 15 mg/dL (ref 5–40)

## 2024-01-18 LAB — HEMOGLOBIN A1C
Est. average glucose Bld gHb Est-mCnc: 117 mg/dL
Hgb A1c MFr Bld: 5.7 % — ABNORMAL HIGH (ref 4.8–5.6)

## 2024-03-07 ENCOUNTER — Encounter: Payer: Self-pay | Admitting: Physician Assistant

## 2024-03-07 ENCOUNTER — Ambulatory Visit: Payer: Self-pay | Admitting: Physician Assistant

## 2024-03-07 VITALS — BP 120/79 | HR 65

## 2024-03-07 DIAGNOSIS — D489 Neoplasm of uncertain behavior, unspecified: Secondary | ICD-10-CM

## 2024-03-07 DIAGNOSIS — L814 Other melanin hyperpigmentation: Secondary | ICD-10-CM | POA: Diagnosis not present

## 2024-03-07 DIAGNOSIS — L578 Other skin changes due to chronic exposure to nonionizing radiation: Secondary | ICD-10-CM | POA: Diagnosis not present

## 2024-03-07 DIAGNOSIS — Z1283 Encounter for screening for malignant neoplasm of skin: Secondary | ICD-10-CM | POA: Diagnosis not present

## 2024-03-07 DIAGNOSIS — L821 Other seborrheic keratosis: Secondary | ICD-10-CM

## 2024-03-07 DIAGNOSIS — D225 Melanocytic nevi of trunk: Secondary | ICD-10-CM | POA: Diagnosis not present

## 2024-03-07 DIAGNOSIS — D1801 Hemangioma of skin and subcutaneous tissue: Secondary | ICD-10-CM

## 2024-03-07 DIAGNOSIS — Z85820 Personal history of malignant melanoma of skin: Secondary | ICD-10-CM | POA: Diagnosis not present

## 2024-03-07 DIAGNOSIS — W908XXA Exposure to other nonionizing radiation, initial encounter: Secondary | ICD-10-CM

## 2024-03-07 DIAGNOSIS — D229 Melanocytic nevi, unspecified: Secondary | ICD-10-CM

## 2024-03-07 NOTE — Patient Instructions (Addendum)

## 2024-03-07 NOTE — Progress Notes (Signed)
 New Patient Visit   Subjective  Christy Johnson is a 68 y.o. female NEW PATIENT who presents for the following:  Total Body Skin Exam (TBSE)  Patient present today for new patient visit for TBSE.The patient denies she has spots, moles and lesions to be evaluated, some may be new or changing and the patient may have concern these could be cancer. Patient has previously been treated by a dermatology in [place], [place]. 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     The following portions of the chart were reviewed this encounter and updated as appropriate: medications, allergies, medical history  Review of Systems:  No other skin or systemic complaints except as noted in HPI or Assessment and Plan.  Objective  Well appearing patient in no apparent distress; mood and affect are within normal limits.  A full examination was performed including scalp, head, eyes, ears, nose, lips, neck, chest, axillae, abdomen, back, buttocks, bilateral upper extremities, bilateral lower extremities, hands, feet, fingers, toes, fingernails, and toenails. All findings within normal limits unless otherwise noted below.   NO PALPABLE CERVICAL, AXILLARY OR INGUINAL LYMPHADENOPATHY.     Relevant exam findings are noted in the Assessment and Plan.  Mid Back 0.6 cm Irregular brown macule    Assessment & Plan   LENTIGINES, SEBORRHEIC KERATOSES, HEMANGIOMAS - Benign normal skin lesions - Benign-appearing - Call for any changes  MELANOCYTIC NEVI - Tan-brown and/or pink-flesh-colored symmetric macules and papules - Benign appearing on exam today - Observation - Call clinic for new or changing moles - Recommend daily use of broad spectrum spf 30+ sunscreen to sun-exposed areas.   ACTINIC DAMAGE - Chronic condition, secondary to cumulative UV/sun exposure - diffuse scaly erythematous macules with underlying dyspigmentation - Recommend daily broad spectrum sunscreen  SPF 30+ to sun-exposed areas, reapply every 2 hours as needed.  - Staying in the shade or wearing long sleeves, sun glasses (UVA+UVB protection) and wide brim hats (4-inch brim around the entire circumference of the hat) are also recommended for sun protection.  - Call for new or changing lesions.  SKIN CANCER SCREENING PERFORMED TODAY  HISTORY OF MELANOMA - No evidence of recurrence today - No lymphadenopathy - Recommend regular full body skin exams - Recommend daily broad spectrum sunscreen SPF 30+ to sun-exposed areas, reapply every 2 hours as needed.  - Call if any new or changing lesions are noted between office visits  NEOPLASM OF UNCERTAIN BEHAVIOR Mid Back Epidermal / dermal shaving  Lesion diameter (cm):  0.6 Informed consent: discussed and consent obtained   Timeout: patient name, date of birth, surgical site, and procedure verified   Procedure prep:  Patient was prepped and draped in usual sterile fashion Prep type:  Isopropyl alcohol Anesthesia: the lesion was anesthetized in a standard fashion   Anesthetic:  1% lidocaine  w/ epinephrine 1-100,000 buffered w/ 8.4% NaHCO3 Instrument used: flexible razor blade   Hemostasis achieved with: pressure, aluminum chloride and electrodesiccation   Outcome: patient tolerated procedure well   Post-procedure details: sterile dressing applied and wound care instructions given   Dressing type: bandage and petrolatum    Specimen 1 - Surgical pathology Differential Diagnosis: 0.6 DN VS MM  Check Margins: No SCREENING EXAM FOR SKIN CANCER   PERSONAL HISTORY OF MALIGNANT MELANOMA OF SKIN   MULTIPLE BENIGN NEVI   SEBORRHEIC KERATOSIS   CHERRY ANGIOMA   ACTINIC SKIN DAMAGE    Return in about 1 year (around 03/07/2025) for  TBSE follow up.  I, Doyce Pan, CMA, am acting as scribe for Shantae Vantol K, PA-C.   Documentation: I have reviewed the above documentation for accuracy and completeness, and I agree with the  above.  Kayla Weekes K, PA-C

## 2024-03-08 LAB — SURGICAL PATHOLOGY

## 2024-03-12 ENCOUNTER — Ambulatory Visit: Payer: Self-pay | Admitting: Physician Assistant

## 2024-04-24 DIAGNOSIS — M25562 Pain in left knee: Secondary | ICD-10-CM | POA: Diagnosis not present

## 2024-04-24 DIAGNOSIS — M13862 Other specified arthritis, left knee: Secondary | ICD-10-CM | POA: Diagnosis not present

## 2024-07-20 ENCOUNTER — Ambulatory Visit: Admitting: Family Medicine

## 2024-09-13 ENCOUNTER — Ambulatory Visit: Admitting: Family Medicine

## 2024-11-27 ENCOUNTER — Encounter: Admitting: Nurse Practitioner

## 2025-03-12 ENCOUNTER — Ambulatory Visit: Admitting: Physician Assistant
